# Patient Record
Sex: Female | Born: 1955
Health system: Southern US, Community
[De-identification: ages and names within clinical notes are randomized; demographics above are authoritative.]

## PROBLEM LIST (undated history)

## (undated) DIAGNOSIS — E079 Disorder of thyroid, unspecified: Secondary | ICD-10-CM

## (undated) DIAGNOSIS — G43909 Migraine, unspecified, not intractable, without status migrainosus: Secondary | ICD-10-CM

## (undated) HISTORY — PX: BREAST REDUCTION SURGERY: SHX8

## (undated) HISTORY — PX: ABDOMINAL HYSTERECTOMY: SHX81

## (undated) HISTORY — PX: CHOLECYSTECTOMY: SHX55

## (undated) HISTORY — DX: Migraine, unspecified, not intractable, without status migrainosus: G43.909

## (undated) HISTORY — PX: THYROIDECTOMY: SHX17

---

## 1998-04-18 ENCOUNTER — Ambulatory Visit (HOSPITAL_COMMUNITY): Admission: RE | Admit: 1998-04-18 | Discharge: 1998-04-18 | Payer: Self-pay | Admitting: Obstetrics & Gynecology

## 1998-11-21 ENCOUNTER — Ambulatory Visit (HOSPITAL_COMMUNITY): Admission: RE | Admit: 1998-11-21 | Discharge: 1998-11-21 | Payer: Self-pay | Admitting: Gastroenterology

## 1998-11-21 ENCOUNTER — Encounter: Payer: Self-pay | Admitting: Gastroenterology

## 1999-04-30 ENCOUNTER — Encounter: Payer: Self-pay | Admitting: Surgery

## 1999-04-30 ENCOUNTER — Ambulatory Visit (HOSPITAL_COMMUNITY): Admission: RE | Admit: 1999-04-30 | Discharge: 1999-04-30 | Payer: Self-pay | Admitting: Surgery

## 1999-04-30 ENCOUNTER — Other Ambulatory Visit: Admission: RE | Admit: 1999-04-30 | Discharge: 1999-04-30 | Payer: Self-pay | Admitting: Surgery

## 1999-06-08 ENCOUNTER — Ambulatory Visit (HOSPITAL_COMMUNITY): Admission: RE | Admit: 1999-06-08 | Discharge: 1999-06-09 | Payer: Self-pay | Admitting: Surgery

## 1999-07-29 ENCOUNTER — Ambulatory Visit (HOSPITAL_COMMUNITY): Admission: RE | Admit: 1999-07-29 | Discharge: 1999-07-29 | Payer: Self-pay | Admitting: Obstetrics & Gynecology

## 1999-07-29 ENCOUNTER — Encounter: Payer: Self-pay | Admitting: Obstetrics & Gynecology

## 1999-12-21 ENCOUNTER — Other Ambulatory Visit: Admission: RE | Admit: 1999-12-21 | Discharge: 1999-12-21 | Payer: Self-pay | Admitting: Obstetrics & Gynecology

## 2001-01-23 ENCOUNTER — Other Ambulatory Visit: Admission: RE | Admit: 2001-01-23 | Discharge: 2001-01-23 | Payer: Self-pay | Admitting: Family Medicine

## 2002-02-13 ENCOUNTER — Other Ambulatory Visit: Admission: RE | Admit: 2002-02-13 | Discharge: 2002-02-13 | Payer: Self-pay | Admitting: Family Medicine

## 2002-10-04 ENCOUNTER — Ambulatory Visit (HOSPITAL_COMMUNITY): Admission: RE | Admit: 2002-10-04 | Discharge: 2002-10-04 | Payer: Self-pay | Admitting: *Deleted

## 2002-10-04 ENCOUNTER — Encounter: Payer: Self-pay | Admitting: *Deleted

## 2003-05-01 ENCOUNTER — Other Ambulatory Visit: Admission: RE | Admit: 2003-05-01 | Discharge: 2003-05-01 | Payer: Self-pay | Admitting: Obstetrics & Gynecology

## 2003-10-23 ENCOUNTER — Ambulatory Visit: Admission: RE | Admit: 2003-10-23 | Discharge: 2003-10-23 | Payer: Self-pay | Admitting: Family Medicine

## 2004-05-08 ENCOUNTER — Other Ambulatory Visit: Admission: RE | Admit: 2004-05-08 | Discharge: 2004-05-08 | Payer: Self-pay | Admitting: Obstetrics & Gynecology

## 2004-05-18 ENCOUNTER — Encounter: Admission: RE | Admit: 2004-05-18 | Discharge: 2004-05-18 | Payer: Self-pay | Admitting: Obstetrics & Gynecology

## 2005-07-06 ENCOUNTER — Other Ambulatory Visit: Admission: RE | Admit: 2005-07-06 | Discharge: 2005-07-06 | Payer: Self-pay | Admitting: Obstetrics & Gynecology

## 2005-07-14 ENCOUNTER — Encounter: Admission: RE | Admit: 2005-07-14 | Discharge: 2005-07-14 | Payer: Self-pay | Admitting: Obstetrics & Gynecology

## 2005-10-20 ENCOUNTER — Emergency Department (HOSPITAL_COMMUNITY): Admission: EM | Admit: 2005-10-20 | Discharge: 2005-10-20 | Payer: Self-pay | Admitting: Emergency Medicine

## 2006-11-15 ENCOUNTER — Encounter: Admission: RE | Admit: 2006-11-15 | Discharge: 2006-11-28 | Payer: Self-pay | Admitting: *Deleted

## 2006-11-17 ENCOUNTER — Ambulatory Visit (HOSPITAL_COMMUNITY): Admission: RE | Admit: 2006-11-17 | Discharge: 2006-11-17 | Payer: Self-pay | Admitting: *Deleted

## 2007-08-23 ENCOUNTER — Emergency Department (HOSPITAL_COMMUNITY): Admission: EM | Admit: 2007-08-23 | Discharge: 2007-08-23 | Payer: Self-pay | Admitting: Emergency Medicine

## 2007-08-29 ENCOUNTER — Encounter: Admission: RE | Admit: 2007-08-29 | Discharge: 2007-08-29 | Payer: Self-pay | Admitting: Gastroenterology

## 2008-07-17 ENCOUNTER — Other Ambulatory Visit: Admission: RE | Admit: 2008-07-17 | Discharge: 2008-07-17 | Payer: Self-pay | Admitting: Obstetrics & Gynecology

## 2009-07-30 ENCOUNTER — Ambulatory Visit: Admission: RE | Admit: 2009-07-30 | Discharge: 2009-07-30 | Payer: Self-pay | Admitting: Family Medicine

## 2011-01-03 ENCOUNTER — Encounter: Payer: Self-pay | Admitting: Obstetrics & Gynecology

## 2011-09-24 LAB — COMPREHENSIVE METABOLIC PANEL
ALT: 40 — ABNORMAL HIGH
Albumin: 4.2
Alkaline Phosphatase: 63
BUN: 14
CO2: 26
Creatinine, Ser: 0.69
Total Bilirubin: 0.9
Total Protein: 6.8

## 2011-09-24 LAB — CBC: Hemoglobin: 13.9

## 2011-09-24 LAB — DIFFERENTIAL
Basophils Relative: 0
Eosinophils Relative: 2
Lymphocytes Relative: 28
Monocytes Relative: 6
Neutro Abs: 5.1
Neutrophils Relative %: 64

## 2011-09-24 LAB — URINALYSIS, ROUTINE W REFLEX MICROSCOPIC
Bilirubin Urine: NEGATIVE
Ketones, ur: NEGATIVE
Nitrite: NEGATIVE
Protein, ur: NEGATIVE
Urobilinogen, UA: 0.2

## 2012-04-25 ENCOUNTER — Other Ambulatory Visit: Payer: Self-pay | Admitting: Obstetrics & Gynecology

## 2012-04-25 DIAGNOSIS — R928 Other abnormal and inconclusive findings on diagnostic imaging of breast: Secondary | ICD-10-CM

## 2012-05-02 ENCOUNTER — Ambulatory Visit
Admission: RE | Admit: 2012-05-02 | Discharge: 2012-05-02 | Disposition: A | Discharge status: Home / Self Care | Payer: 59 | Source: Ambulatory Visit | Attending: Obstetrics & Gynecology | Admitting: Obstetrics & Gynecology

## 2012-05-02 DIAGNOSIS — R928 Other abnormal and inconclusive findings on diagnostic imaging of breast: Secondary | ICD-10-CM

## 2012-06-05 ENCOUNTER — Other Ambulatory Visit: Payer: Self-pay | Admitting: Family Medicine

## 2012-06-05 DIAGNOSIS — R202 Paresthesia of skin: Secondary | ICD-10-CM

## 2012-06-09 ENCOUNTER — Ambulatory Visit
Admission: RE | Admit: 2012-06-09 | Discharge: 2012-06-09 | Disposition: A | Discharge status: Home / Self Care | Payer: 59 | Source: Ambulatory Visit | Attending: Family Medicine | Admitting: Family Medicine

## 2012-06-09 DIAGNOSIS — R202 Paresthesia of skin: Secondary | ICD-10-CM

## 2012-12-01 ENCOUNTER — Other Ambulatory Visit: Payer: Self-pay | Admitting: Neurology

## 2012-12-01 DIAGNOSIS — R209 Unspecified disturbances of skin sensation: Secondary | ICD-10-CM

## 2012-12-01 DIAGNOSIS — G43009 Migraine without aura, not intractable, without status migrainosus: Secondary | ICD-10-CM

## 2012-12-01 DIAGNOSIS — R9409 Abnormal results of other function studies of central nervous system: Secondary | ICD-10-CM

## 2012-12-07 ENCOUNTER — Ambulatory Visit
Admission: RE | Admit: 2012-12-07 | Discharge: 2012-12-07 | Disposition: A | Discharge status: Home / Self Care | Payer: 59 | Source: Ambulatory Visit | Attending: Neurology | Admitting: Neurology

## 2012-12-07 DIAGNOSIS — G43009 Migraine without aura, not intractable, without status migrainosus: Secondary | ICD-10-CM

## 2012-12-07 DIAGNOSIS — R209 Unspecified disturbances of skin sensation: Secondary | ICD-10-CM

## 2012-12-07 DIAGNOSIS — R9409 Abnormal results of other function studies of central nervous system: Secondary | ICD-10-CM

## 2013-04-25 ENCOUNTER — Other Ambulatory Visit: Payer: Self-pay | Admitting: Family Medicine

## 2013-04-25 DIAGNOSIS — R109 Unspecified abdominal pain: Secondary | ICD-10-CM

## 2013-04-26 ENCOUNTER — Ambulatory Visit
Admission: RE | Admit: 2013-04-26 | Discharge: 2013-04-26 | Disposition: A | Discharge status: Home / Self Care | Payer: 59 | Source: Ambulatory Visit | Attending: Family Medicine | Admitting: Family Medicine

## 2013-04-26 DIAGNOSIS — R109 Unspecified abdominal pain: Secondary | ICD-10-CM

## 2014-01-23 ENCOUNTER — Other Ambulatory Visit: Payer: Self-pay | Admitting: Neurology

## 2014-05-17 ENCOUNTER — Other Ambulatory Visit: Payer: Self-pay | Admitting: Obstetrics & Gynecology

## 2014-05-17 DIAGNOSIS — N6489 Other specified disorders of breast: Secondary | ICD-10-CM

## 2014-05-17 DIAGNOSIS — R922 Inconclusive mammogram: Secondary | ICD-10-CM

## 2014-05-28 ENCOUNTER — Other Ambulatory Visit: Payer: 59

## 2014-05-28 ENCOUNTER — Ambulatory Visit
Admission: RE | Admit: 2014-05-28 | Discharge: 2014-05-28 | Disposition: A | Discharge status: Home / Self Care | Payer: 59 | Source: Ambulatory Visit | Attending: Obstetrics & Gynecology | Admitting: Obstetrics & Gynecology

## 2014-05-28 DIAGNOSIS — N6489 Other specified disorders of breast: Secondary | ICD-10-CM

## 2014-05-28 DIAGNOSIS — R922 Inconclusive mammogram: Secondary | ICD-10-CM

## 2014-07-02 ENCOUNTER — Other Ambulatory Visit: Payer: Self-pay | Admitting: Gastroenterology

## 2014-07-02 DIAGNOSIS — R1013 Epigastric pain: Secondary | ICD-10-CM

## 2014-07-08 ENCOUNTER — Ambulatory Visit
Admission: RE | Admit: 2014-07-08 | Discharge: 2014-07-08 | Disposition: A | Discharge status: Home / Self Care | Payer: 59 | Source: Ambulatory Visit | Attending: Gastroenterology | Admitting: Gastroenterology

## 2014-07-08 DIAGNOSIS — R1013 Epigastric pain: Secondary | ICD-10-CM

## 2014-07-08 MED ORDER — GADOBENATE DIMEGLUMINE 529 MG/ML IV SOLN
18.0000 mL | Freq: Once | INTRAVENOUS | Status: AC | PRN
  Administered 2014-07-08: 18 mL via INTRAVENOUS

## 2015-05-19 ENCOUNTER — Other Ambulatory Visit: Payer: Self-pay | Admitting: Obstetrics & Gynecology

## 2015-05-20 LAB — CYTOLOGY - PAP

## 2016-12-16 ENCOUNTER — Other Ambulatory Visit: Payer: Self-pay

## 2016-12-16 DIAGNOSIS — M720 Palmar fascial fibromatosis [Dupuytren]: Secondary | ICD-10-CM | POA: Diagnosis not present

## 2016-12-16 DIAGNOSIS — D492 Neoplasm of unspecified behavior of bone, soft tissue, and skin: Secondary | ICD-10-CM | POA: Diagnosis not present

## 2016-12-16 DIAGNOSIS — R2232 Localized swelling, mass and lump, left upper limb: Secondary | ICD-10-CM | POA: Diagnosis not present

## 2017-01-17 DIAGNOSIS — R2232 Localized swelling, mass and lump, left upper limb: Secondary | ICD-10-CM | POA: Diagnosis not present

## 2017-05-23 DIAGNOSIS — R7303 Prediabetes: Secondary | ICD-10-CM | POA: Diagnosis not present

## 2017-05-23 DIAGNOSIS — E7800 Pure hypercholesterolemia, unspecified: Secondary | ICD-10-CM | POA: Diagnosis not present

## 2017-05-23 DIAGNOSIS — Z01419 Encounter for gynecological examination (general) (routine) without abnormal findings: Secondary | ICD-10-CM | POA: Diagnosis not present

## 2017-05-23 DIAGNOSIS — Z0000 Encounter for general adult medical examination without abnormal findings: Secondary | ICD-10-CM | POA: Diagnosis not present

## 2017-05-23 DIAGNOSIS — E039 Hypothyroidism, unspecified: Secondary | ICD-10-CM | POA: Diagnosis not present

## 2017-08-02 DIAGNOSIS — J0100 Acute maxillary sinusitis, unspecified: Secondary | ICD-10-CM | POA: Diagnosis not present

## 2017-08-13 DIAGNOSIS — T63461A Toxic effect of venom of wasps, accidental (unintentional), initial encounter: Secondary | ICD-10-CM | POA: Diagnosis not present

## 2017-11-15 DIAGNOSIS — M1811 Unilateral primary osteoarthritis of first carpometacarpal joint, right hand: Secondary | ICD-10-CM | POA: Diagnosis not present

## 2017-11-15 DIAGNOSIS — M720 Palmar fascial fibromatosis [Dupuytren]: Secondary | ICD-10-CM | POA: Diagnosis not present

## 2018-02-28 DIAGNOSIS — H6981 Other specified disorders of Eustachian tube, right ear: Secondary | ICD-10-CM | POA: Diagnosis not present

## 2018-02-28 DIAGNOSIS — R350 Frequency of micturition: Secondary | ICD-10-CM | POA: Diagnosis not present

## 2018-05-24 DIAGNOSIS — Z0000 Encounter for general adult medical examination without abnormal findings: Secondary | ICD-10-CM | POA: Diagnosis not present

## 2018-05-24 DIAGNOSIS — Z01419 Encounter for gynecological examination (general) (routine) without abnormal findings: Secondary | ICD-10-CM | POA: Diagnosis not present

## 2018-05-24 DIAGNOSIS — E039 Hypothyroidism, unspecified: Secondary | ICD-10-CM | POA: Diagnosis not present

## 2018-05-24 DIAGNOSIS — R7303 Prediabetes: Secondary | ICD-10-CM | POA: Diagnosis not present

## 2018-05-24 DIAGNOSIS — E7800 Pure hypercholesterolemia, unspecified: Secondary | ICD-10-CM | POA: Diagnosis not present

## 2018-05-24 DIAGNOSIS — Z6829 Body mass index (BMI) 29.0-29.9, adult: Secondary | ICD-10-CM | POA: Diagnosis not present

## 2018-05-26 ENCOUNTER — Other Ambulatory Visit: Payer: Self-pay | Admitting: Urology

## 2018-05-26 DIAGNOSIS — R300 Dysuria: Secondary | ICD-10-CM | POA: Diagnosis not present

## 2018-05-26 DIAGNOSIS — N361 Urethral diverticulum: Secondary | ICD-10-CM

## 2018-05-26 DIAGNOSIS — N393 Stress incontinence (female) (male): Secondary | ICD-10-CM | POA: Diagnosis not present

## 2018-05-26 DIAGNOSIS — R350 Frequency of micturition: Secondary | ICD-10-CM | POA: Diagnosis not present

## 2018-06-08 ENCOUNTER — Ambulatory Visit (HOSPITAL_COMMUNITY)
Admission: RE | Admit: 2018-06-08 | Discharge: 2018-06-08 | Disposition: A | Discharge status: Home / Self Care | Payer: 59 | Source: Ambulatory Visit | Attending: Urology | Admitting: Urology

## 2018-06-08 DIAGNOSIS — N361 Urethral diverticulum: Secondary | ICD-10-CM | POA: Insufficient documentation

## 2018-06-08 DIAGNOSIS — R102 Pelvic and perineal pain: Secondary | ICD-10-CM | POA: Diagnosis not present

## 2018-06-08 MED ORDER — GADOBENATE DIMEGLUMINE 529 MG/ML IV SOLN
20.0000 mL | Freq: Once | INTRAVENOUS | Status: AC | PRN
  Administered 2018-06-08: 16 mL via INTRAVENOUS

## 2018-06-09 DIAGNOSIS — R350 Frequency of micturition: Secondary | ICD-10-CM | POA: Diagnosis not present

## 2018-06-09 DIAGNOSIS — R300 Dysuria: Secondary | ICD-10-CM | POA: Diagnosis not present

## 2018-06-09 LAB — POCT I-STAT CREATININE: CREATININE: 0.7 mg/dL (ref 0.44–1.00)

## 2018-08-11 DIAGNOSIS — L821 Other seborrheic keratosis: Secondary | ICD-10-CM | POA: Diagnosis not present

## 2018-08-11 DIAGNOSIS — L814 Other melanin hyperpigmentation: Secondary | ICD-10-CM | POA: Diagnosis not present

## 2018-08-11 DIAGNOSIS — D225 Melanocytic nevi of trunk: Secondary | ICD-10-CM | POA: Diagnosis not present

## 2018-08-31 DIAGNOSIS — L578 Other skin changes due to chronic exposure to nonionizing radiation: Secondary | ICD-10-CM | POA: Diagnosis not present

## 2018-08-31 DIAGNOSIS — L570 Actinic keratosis: Secondary | ICD-10-CM | POA: Diagnosis not present

## 2019-03-17 IMAGING — MR MR PELVIS WO/W CM
4 of 7 series · 19 of 48 positions shown · IV contrast (multihance)
Comparison: None.

CLINICAL DATA: Pelvic/bladder pain x4 months, evaluate for urethral
diverticulum

EXAM:
MRI PELVIS WITHOUT AND WITH CONTRAST
TECHNIQUE: Multiplanar multisequence MR imaging of the pelvis was performed
both before and after administration of intravenous contrast.
CONTRAST:  16mL MULTIHANCE GADOBENATE DIMEGLUMINE 529 MG/ML IV SOLN

[Series 3: T2 · axial · 5.0mm · 0.47mm/px · z∈[-64,+152]mm · 7 of 37 slices shown (1 of 2)]
[im 1/37]
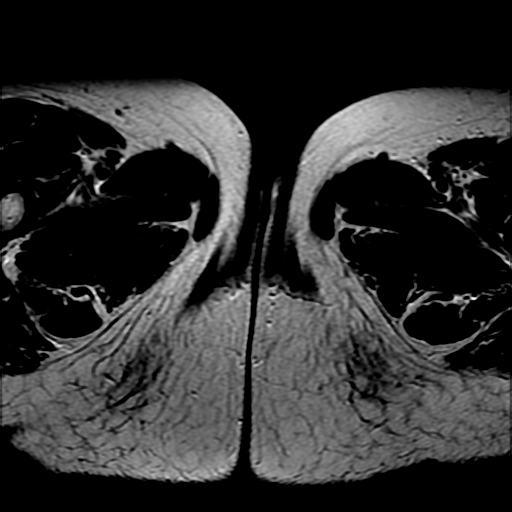
[im 7/37]
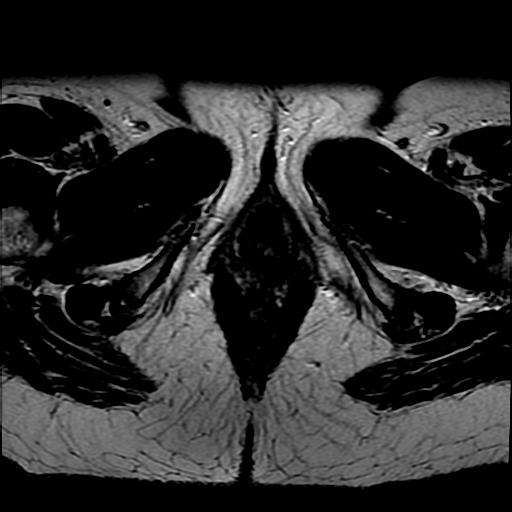
[im 13/37]
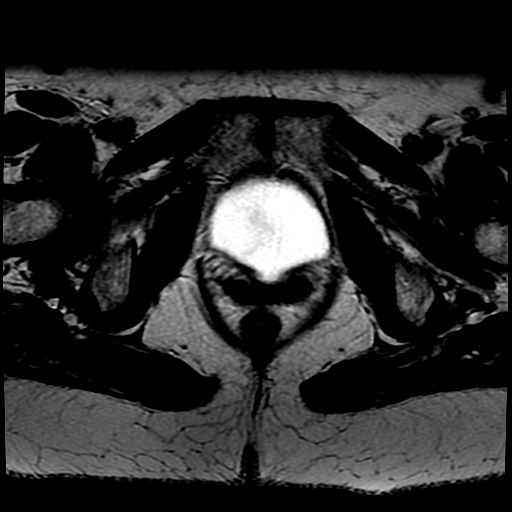
[im 19/37]
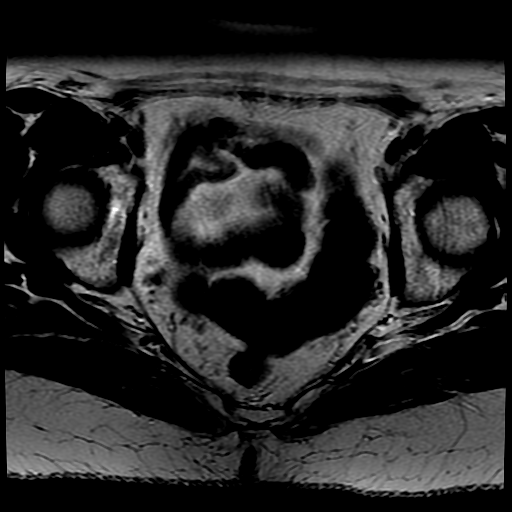
[im 25/37]
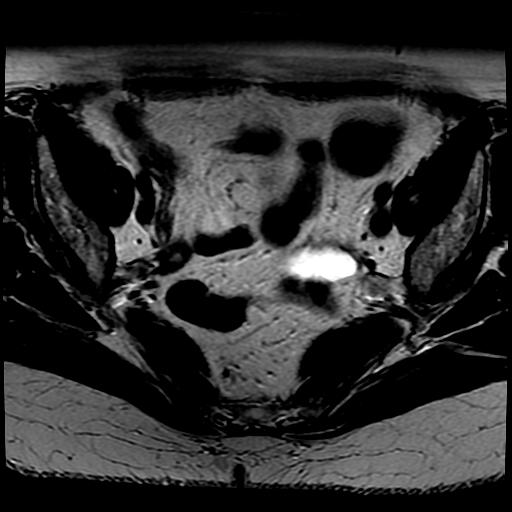
[im 31/37]
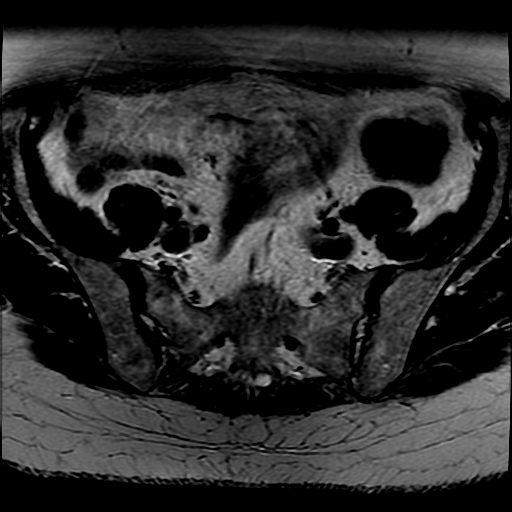
[im 37/37]
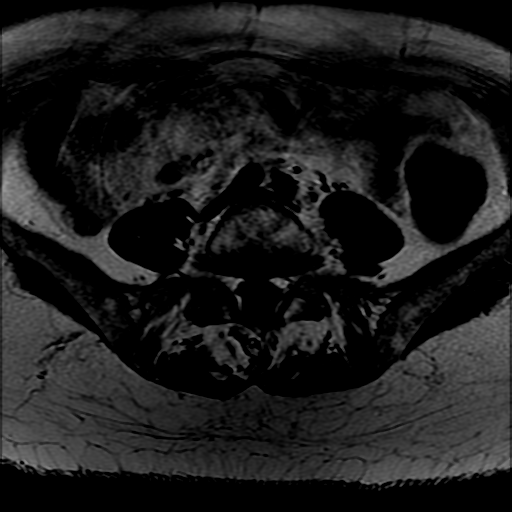

[Series 4: T2 · sagittal · 5.0mm · 0.47mm/px · 5 of 29 slices shown (2 of 2)]
[im 1/29]
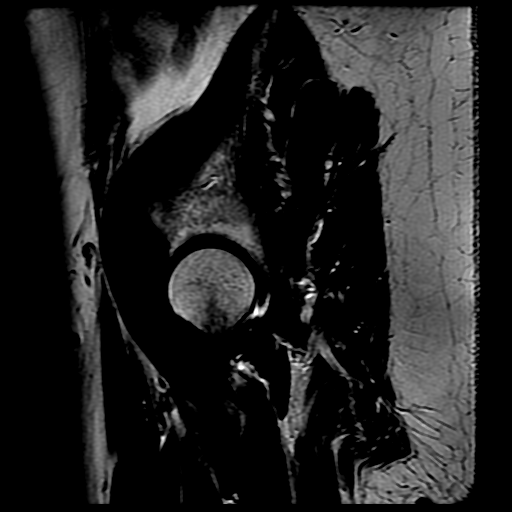
[im 8/29]
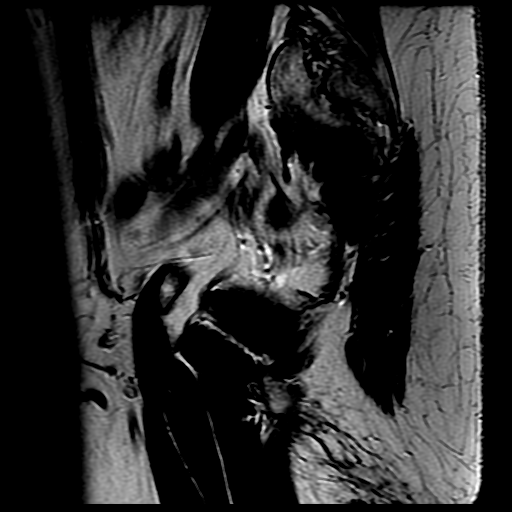
[im 15/29]
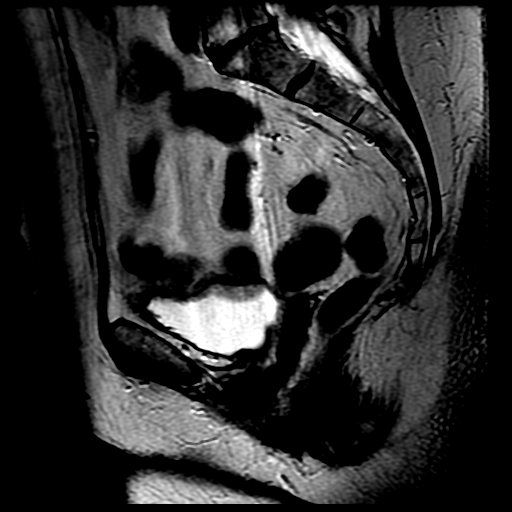
[im 22/29]
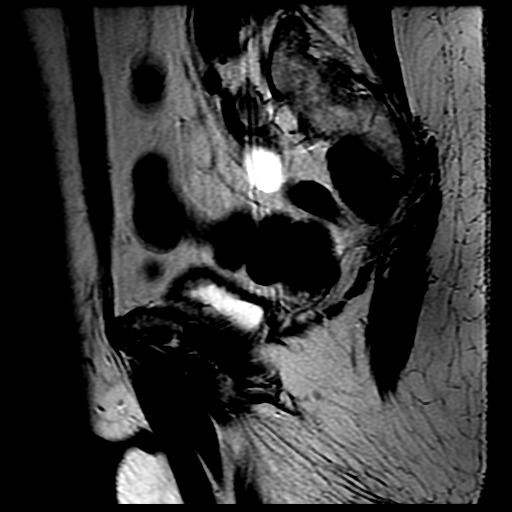
[im 29/29]
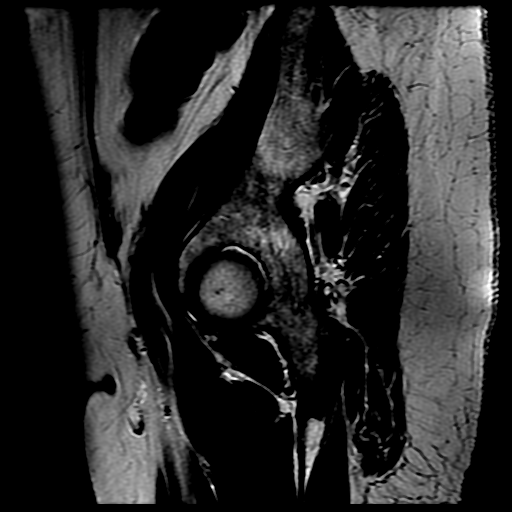

[Series 5: T2 fat-sat · axial · 5.0mm · 0.47mm/px · z∈[-64,+116]mm · 4 of 37 slices shown (1 of 2)]
[im 1/37]
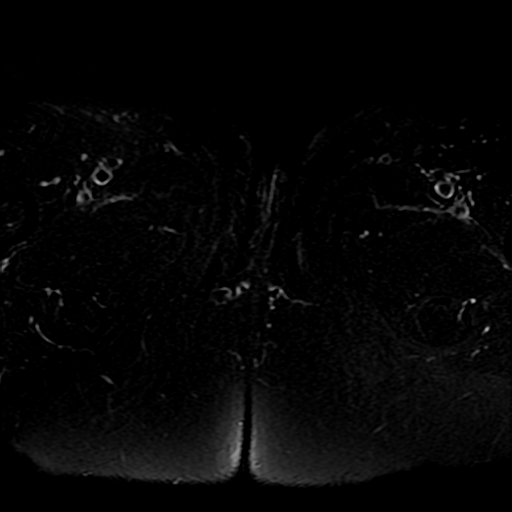
[im 7/37]
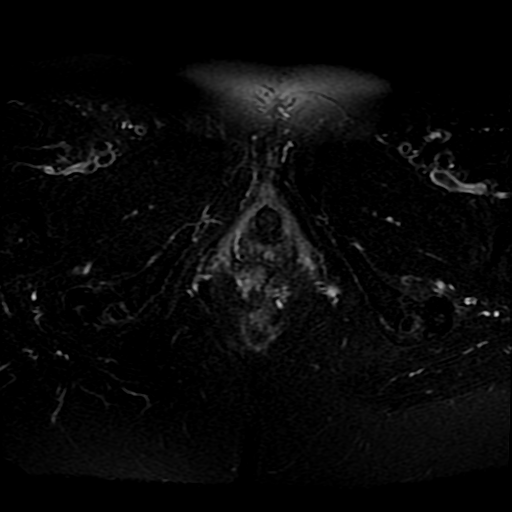
[im 19/37]
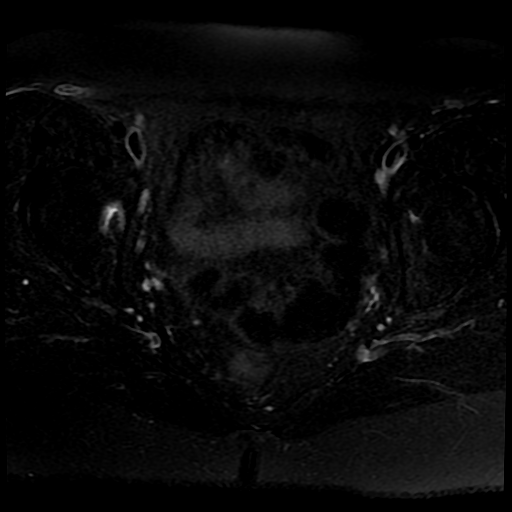
[im 31/37]
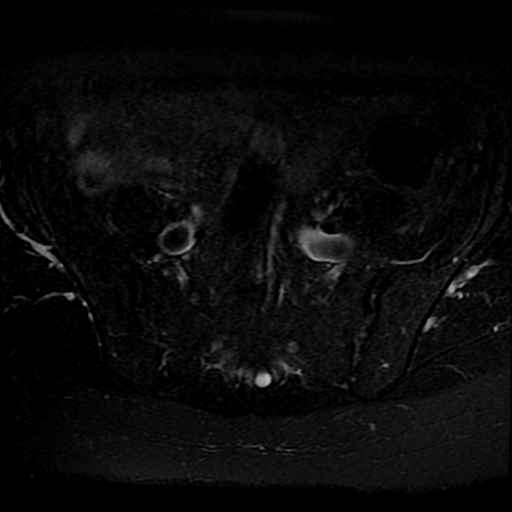

[Series 6: T2 fat-sat · coronal · 4.0mm · 0.39mm/px · 3 of 43 slices shown (2 of 2)]
[im 7/43]
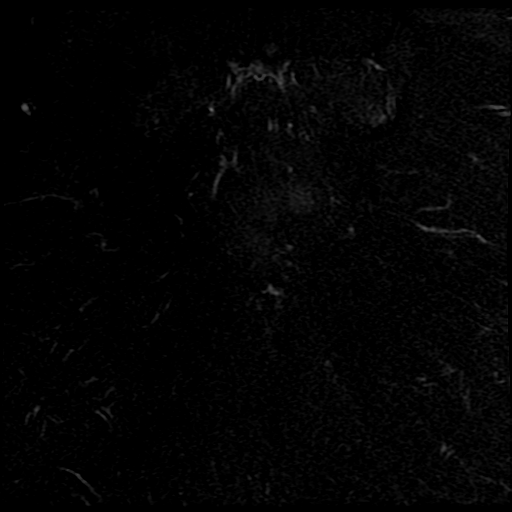
[im 25/43]
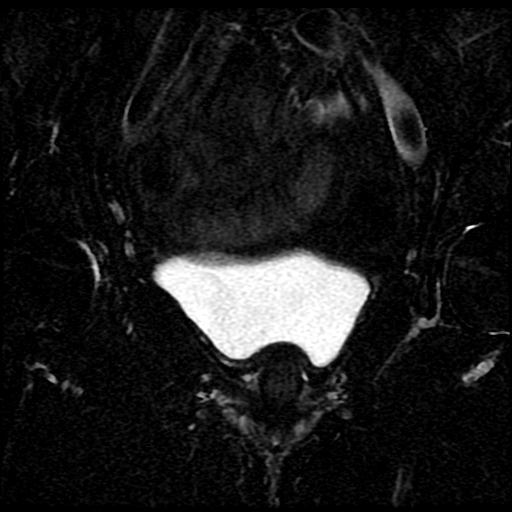
[im 37/43]
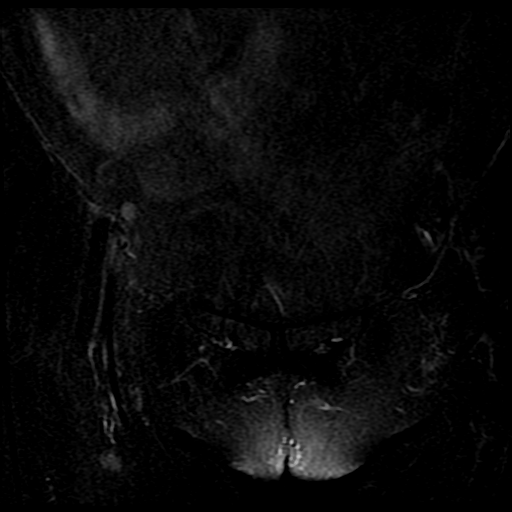

[19 of 48 positions shown; findings below may reference images not displayed]

FINDINGS: Urinary Tract:  Bladder is within normal limits.

No findings to suggest urethral diverticulum.

Bowel:  Visualized bowel is unremarkable.

Vascular/Lymphatic: No evidence of aneurysm.

No suspicious pelvic lymphadenopathy.

Reproductive:  Status post hysterectomy.

No findings to suggest vaginal wall or perineal cyst.

2.3 x 3.9 cm left adnexal cyst (series 3/image 12). The bilateral
ovaries are not discretely visualized.

Other:  No pelvic ascites.

Musculoskeletal: No focal osseous lesions.
IMPRESSION: No findings to suggest urethral diverticulum, vaginal wall cyst, or
perineal cyst.

## 2020-01-14 ENCOUNTER — Encounter: Payer: Self-pay | Admitting: Physician Assistant

## 2021-09-24 ENCOUNTER — Other Ambulatory Visit: Payer: Self-pay | Admitting: Urology

## 2021-10-20 NOTE — Patient Instructions (Addendum)
DUE TO COVID-19 ONLY ONE VISITOR IS ALLOWED TO COME WITH YOU AND STAY IN THE WAITING ROOM ONLY DURING PRE OP AND PROCEDURE.   **NO VISITORS ARE ALLOWED IN THE SHORT STAY AREA OR RECOVERY ROOM!!**  IF YOU WILL BE ADMITTED INTO THE HOSPITAL YOU ARE ALLOWED ONLY TWO SUPPORT PEOPLE DURING VISITATION HOURS ONLY (7AM -8PM)   The support person(s) may change daily. The support person(s) must pass our screening, gel in and out, and wear a mask at all times, including in the patient's room. Patients must also wear a mask when staff or their support person are in the room.  No visitors under the age of 39. Any visitor under the age of 70 must be accompanied by an adult.    COVID SWAB TESTING MUST BE COMPLETED ON:  10/23/21 **MUST PRESENT COMPLETED FORM AT TESTING SITE**    [place] (backside of the building) Open 8am-3pm. No appointment needed. You are not required to quarantine, however you are required to wear a well-fitted mask when you are out and around people not in your household.  Hand Hygiene often Do NOT share personal items Notify your provider if you are in close contact with someone who has COVID or you develop fever 100.4 or greater, new onset of sneezing, cough, sore throat, shortness of breath or body aches.       Your procedure is scheduled on: 10/27/21   Report to [facility] Main Entrance   Report to front desk at 5:15 AM     Call this number if you have problems the morning of surgery [phone]   Do not eat food :After Midnight.   May have liquids until 4:30 AM day of surgery  CLEAR LIQUID DIET  Foods Allowed                                                                     Foods Excluded  Water, Black Coffee and tea (no milk or creamer)           liquids that you cannot  Plain Jell-O in any flavor  (No red)                                    see through such as: Fruit ices (not with fruit pulp)                                             milk, soups, orange juice              Iced Popsicles (No red)                                                All solid food  Apple juices Sports drinks like Gatorade (No red) Lightly seasoned clear broth or consume(fat free) Sugar   Oral Hygiene is also important to reduce your risk of infection.                                    Remember - BRUSH YOUR TEETH THE MORNING OF SURGERY WITH YOUR REGULAR TOOTHPASTE   Take these medicines the morning of surgery with A SIP OF WATER: Synthroid                               You may not have any metal on your body including hair pins, jewelry, and body piercing             Do not wear make-up, lotions, powders, perfumes, or deodorant  Do not wear nail polish including gel and S&S, artificial/acrylic nails, or any other type of covering on natural nails including finger and toenails. If you have artificial nails, gel coating, etc. that needs to be removed by a nail salon please have this removed prior to surgery or surgery may need to be canceled/ delayed if the surgeon/ anesthesia feels like they are unable to be safely monitored.   Do not shave  48 hours prior to surgery.    Do not bring valuables to the hospital. [place].   Bring small overnight bag day of surgery.  Please read over the following fact sheets you were given: IF YOU HAVE QUESTIONS ABOUT YOUR PRE-OP INSTRUCTIONS PLEASE CALL [place] - Preparing for Surgery Before surgery, you can play an important role.  Because skin is not sterile, your skin needs to be as free of germs as possible.  You can reduce the number of germs on your skin by washing with CHG (chlorahexidine gluconate) soap before surgery.  CHG is an antiseptic cleaner which kills germs and bonds with the skin to continue killing germs even after washing. Please DO NOT use if you have an allergy to CHG or  antibacterial soaps.  If your skin becomes reddened/irritated stop using the CHG and inform your nurse when you arrive at Short Stay. Do not shave (including legs and underarms) for at least 48 hours prior to the first CHG shower.  You may shave your face/neck.  Please follow these instructions carefully:  1.  Shower with CHG Soap the night before surgery and the  morning of surgery.  2.  If you choose to wash your hair, wash your hair first as usual with your normal  shampoo.  3.  After you shampoo, rinse your hair and body thoroughly to remove the shampoo.                             4.  Use CHG as you would any other liquid soap.  You can apply chg directly to the skin and wash.  Gently with a scrungie or clean washcloth.  5.  Apply the CHG Soap to your body ONLY FROM THE NECK DOWN.   Do   not use on face/ open  Wound or open sores. Avoid contact with eyes, ears mouth and   genitals (private parts).                       Wash face,  Genitals (private parts) with your normal soap.             6.  Wash thoroughly, paying special attention to the area where your    surgery  will be performed.  7.  Thoroughly rinse your body with warm water from the neck down.  8.  DO NOT shower/wash with your normal soap after using and rinsing off the CHG Soap.                9.  Pat yourself dry with a clean towel.            10.  Wear clean pajamas.            11.  Place clean sheets on your bed the night of your first shower and do not  sleep with pets. Day of Surgery : Do not apply any lotions/deodorants the morning of surgery.  Please wear clean clothes to the hospital/surgery center.  FAILURE TO FOLLOW THESE INSTRUCTIONS MAY RESULT IN THE CANCELLATION OF YOUR SURGERY  PATIENT SIGNATURE_________________________________  NURSE SIGNATURE__________________________________  ________________________________________________________________________

## 2021-10-20 NOTE — Progress Notes (Addendum)
COVID swab appointment: 10/23/21  COVID Vaccine Completed: yes x2 Date COVID Vaccine completed: Has received booster: COVID vaccine manufacturer: [place]   Date of COVID positive in last 90 days: no  PCP - Shirline Frees, MD Cardiologist - n/a  Chest x-ray - n/a EKG - 10/21/21 Epic/chart Stress Test - yes long time ago per pt ECHO - n/a Cardiac Cath - n/a Pacemaker/ICD device last checked: n/a Spinal Cord Stimulator: n/a  Sleep Study - n/a CPAP -   Fasting Blood Sugar - DM does not check at home, pt denies history Checks Blood Sugar _____ times a day  Blood Thinner Instructions: n/a Aspirin Instructions: Last Dose:  Activity level: Can go up a flight of stairs and perform activities of daily living without stopping and without symptoms of chest pain or shortness of breath.    Anesthesia review:   Patient denies shortness of breath, fever, cough and chest pain at PAT appointment   Patient verbalized understanding of instructions that were given to them at the PAT appointment. Patient was also instructed that they will need to review over the PAT instructions again at home before surgery.

## 2021-10-20 NOTE — H&P (Signed)
2019  I saw patient 2019 for mild urethritis and discomfort in the urethra. I got an MRI and she did not have a Skene gland cyst. She may have had a little bit of urethral mucosal prolapse. She had a small grade 1 cystocele   Today  I was consulted to assess the patient's prolapse. She feels vaginal bulging. She reduces it. No splinting maneuvers. She has had a hysterectomy.   At baseline she leaks rarely with coughing sneezing and urgency. She wears 2 liners a day that can be damp or moderately wet. No bedwetting   She voids every 1-2 hours. If she holds longer she can leak. She gets up 0-1 time at night. She reports a good flow   no further urethral discomfort issues   On pelvic examination patient had grade 2 cystocele with central defect. When she would cough or bear down strongly she had rotational descent but still had good vaginal length. I think the vaginal cuff to sent from 8 or 9 cm to approximately 7 cm but I could not see the dimples at the apex. She had a grade 1 diffuse rectocele. She a grade 2 hypermobility the bladder neck and negative cough test. She had a lot of spring back and at rest had minimal defect.   urine reviewed. Urine sent for culture. Chart reviewed. Bladder scan 95 mL but patient had not voided   Patient has mild mixed incontinence. She has moderate frequency and prolapse symptoms. Picture was drawn. Patient had surgery she would likely best benefit from a anterior repair. She would be consented for a vault suspension rectocele repair but probably does not need one. Role of urodynamics discussed.With contrast also reassess the size of her cystocele   Today  Frequency stable. Incontinence stable.  On urodynamics the patient did not void and was catheterized for a few mL. Maximum bladder capacity was 550 mL. Bladder was stable. No stress incontinence but could not generate a Valsalva pressure greater than 54 cm water. During voluntary voiding she voided 51 mL with a  maximum flow of 8 mL/second. Maximum voiding pressure was 38 cm water. Residual was approximately 680 mL. EMG activities was within normal limits. Cystocele did not greatly increase with bladder neck descended 1 cm. Mild bladder trabeculation. No stress incontinence with without prolapse reduced. The details of the urodynamics or sign dictated   Picture drawn. Watchful waiting versus pessary versus transvaginal cystocele repair versus transvaginal vault suspension cystocele repair and graft and rectocele repaired described. Persistent incontinence or worsening incontinence with sequelae discussed. Need for future procedures discussed. A initial postvoid residual was 94 mL. She does have a large capacity bladder. She might be at higher risk of retention with a sling. She understands her incontinence would be treated nonsurgically this point   patient would like to proceed with surgery     ALLERGIES: None   MEDICATIONS: Advil  Aleve  Allegra Allergy  Cyclobenzaprine Hcl 10 mg tablet  Krill Oil  Magnesium  [place]  Multivitamin  Synthroid 88 mcg tablet tablet     GU PSH: Complex cystometrogram, w/ void pressure and urethral pressure profile studies, any technique - 09/09/2021 Complex Uroflow - 09/09/2021 Cystoscopy - 2019 Emg surf Electrd - 09/09/2021 Inject For cystogram - 09/09/2021 Intrabd voidng Press - 09/09/2021     NON-GU PSH: Breast Reduction - 2004 Partial Hysterectomy - 1992 Remove Gallbladder - 1991 Thyroidectomy - 1999 Total Hysterectomy - 1993     GU PMH: Mixed incontinence - 09/09/2021, -  08/28/2021 Cystocele, midline - 08/28/2021 Nocturia - 08/28/2021 Urinary Frequency (Stable) - 08/28/2021, - 2019 Dysuria - 2019 Stress Incontinence - 2019    NON-GU PMH: Arthritis    FAMILY HISTORY: 2 sons - Son father deceased - Father Kidney Failure - Father Kidney Transplant - Father mother living - Mother   SOCIAL HISTORY: Marital Status: Married Ethnicity: Not Hispanic  Or Latino; Race: White Current Smoking Status: Patient has never smoked.   Tobacco Use Assessment Completed: Used Tobacco in last 30 days? Drinks 4 drinks per week. Types of alcohol consumed: Wine.  Does not use drugs. Drinks 2 caffeinated drinks per day. 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    REVIEW OF SYSTEMS:    GU Review Female:   Patient reports frequent urination, hard to postpone urination, leakage of urine, and have to strain to urinate. Patient denies burning /pain with urination, get up at night to urinate, stream starts and stops, trouble starting your stream, and being pregnant.  Gastrointestinal (Upper):   Patient denies nausea, vomiting, and indigestion/ heartburn.  Gastrointestinal (Lower):   Patient denies diarrhea and constipation.  Constitutional:   Patient denies fever, night sweats, weight loss, and fatigue.  Skin:   Patient denies skin rash/ lesion and itching.  Eyes:   Patient denies blurred vision and double vision.  Ears/ Nose/ Throat:   Patient denies sore throat and sinus problems.  Hematologic/Lymphatic:   Patient denies swollen glands and easy bruising.  Cardiovascular:   Patient denies leg swelling and chest pains.  Respiratory:   Patient denies cough and shortness of breath.  Endocrine:   Patient denies excessive thirst.  Musculoskeletal:   Patient reports back pain. Patient denies joint pain.  Neurological:   Patient denies headaches and dizziness.  Psychologic:   Patient denies anxiety and depression.   VITAL SIGNS: None   PAST DATA REVIEW: None   PROCEDURES:          Urinalysis w/Scope Dipstick Dipstick Cont'd Micro  Color: Yellow Bilirubin: Neg mg/dL WBC/hpf: 0 - 5/hpf  Appearance: Clear Ketones: Neg mg/dL RBC/hpf: NS (Not Seen)  Specific Gravity: 1.015 Blood: Neg ery/uL Bacteria: NS (Not Seen)  pH: 5.5 Protein: Neg mg/dL Cystals: NS (Not Seen)  Glucose: Neg mg/dL Urobilinogen: 0.2 mg/dL Casts: NS (Not Seen)     Nitrites: Neg Trichomonas: Not Present    Leukocyte Esterase: Trace leu/uL Mucous: Not Present      Epithelial Cells: 0 - 5/hpf      Yeast: NS (Not Seen)      Sperm: Not Present    ASSESSMENT:      ICD-10 Details  1 GU:   Mixed incontinence - N39.46   2   Cystocele, midline - N81.11               Notes:   I drew her a picture and we talked about prolapse surgery in detail. Pros, cons, general surgical and anesthetic risks, and other options including behavioral therapy, pessaries, and watchful waiting were discussed. She understands that prolapse repairs are successful in 80-85% of cases for prolapse symptoms and can recur anteriorly, posteriorly, and/or apically. She understands that in most cases I use a graft and general risks were discussed. Surgical risks were described but not limited to the discussion of injury to neighboring structures including the bowel (with possible life-threatening sepsis and colostomy), bladder, urethra, vagina (all resulting in further surgery), and ureter (resulting in re-implantation). We talked about injury to nerves/soft tissue leading to debilitating  and intractable pelvic, abdominal, and lower extremity pain syndromes and neuropathies. The risks of buttock pain, intractable dyspareunia, and vaginal narrowing and shortening with sequelae were discussed. Bleeding risks, transfusion rates, and infection were discussed. The risk of persistent, de novo, or worsening bladder and/or bowel incontinence/dysfunction was discussed. The need for CIC was described as well the usual post-operative course. The patient understands that she might not reach her treatment goal and that she might be worse following surgery.

## 2021-10-21 ENCOUNTER — Encounter (HOSPITAL_COMMUNITY): Payer: Self-pay

## 2021-10-21 ENCOUNTER — Encounter (HOSPITAL_COMMUNITY)
Admission: RE | Admit: 2021-10-21 | Discharge: 2021-10-21 | Disposition: A | Discharge status: Home / Self Care | Payer: 59 | Source: Ambulatory Visit | Attending: Urology | Admitting: Urology

## 2021-10-21 VITALS — BP 143/85 | HR 91 | Temp 97.9°F | Resp 14 | Ht 64.5 in | Wt 160.2 lb

## 2021-10-21 DIAGNOSIS — E119 Type 2 diabetes mellitus without complications: Secondary | ICD-10-CM | POA: Insufficient documentation

## 2021-10-21 DIAGNOSIS — Z01818 Encounter for other preprocedural examination: Secondary | ICD-10-CM | POA: Diagnosis not present

## 2021-10-21 HISTORY — DX: Disorder of thyroid, unspecified: E07.9

## 2021-10-21 LAB — CBC
HCT: 44.9 % (ref 36.0–46.0)
Hemoglobin: 14.5 g/dL (ref 12.0–15.0)
MCH: 30.3 pg (ref 26.0–34.0)
MCHC: 32.3 g/dL (ref 30.0–36.0)
MCV: 93.7 fL (ref 80.0–100.0)
Platelets: 225 10*3/uL (ref 150–400)
RBC: 4.79 MIL/uL (ref 3.87–5.11)
RDW: 11.9 % (ref 11.5–15.5)
WBC: 4.8 10*3/uL (ref 4.0–10.5)
nRBC: 0 % (ref 0.0–0.2)

## 2021-10-21 LAB — BASIC METABOLIC PANEL
Anion gap: 7 (ref 5–15)
BUN: 17 mg/dL (ref 8–23)
CO2: 30 mmol/L (ref 22–32)
Calcium: 10.2 mg/dL (ref 8.9–10.3)
Chloride: 103 mmol/L (ref 98–111)
Creatinine, Ser: 0.65 mg/dL (ref 0.44–1.00)
GFR, Estimated: >60 mL/min (ref >60)
Glucose, Bld: 114 mg/dL — ABNORMAL HIGH (ref 70–99)
Potassium: 4.2 mmol/L (ref 3.5–5.1)
Sodium: 140 mmol/L (ref 135–145)

## 2021-10-21 LAB — PROTIME-INR
INR: 0.9 (ref 0.8–1.2)
Prothrombin Time: 11.9 seconds (ref 11.4–15.2)

## 2021-10-21 LAB — GLUCOSE, CAPILLARY: Glucose-Capillary: 123 mg/dL — ABNORMAL HIGH (ref 70–99)

## 2021-10-21 LAB — HEMOGLOBIN A1C
Hgb A1c MFr Bld: 6.3 % — ABNORMAL HIGH (ref 4.8–5.6)
Mean Plasma Glucose: 134.11 mg/dL

## 2021-10-23 ENCOUNTER — Other Ambulatory Visit: Payer: Self-pay | Admitting: Urology

## 2021-10-23 LAB — SARS CORONAVIRUS 2 (TAT 6-24 HRS): SARS Coronavirus 2: NEGATIVE

## 2021-10-26 NOTE — Anesthesia Preprocedure Evaluation (Addendum)
Anesthesia Evaluation  Patient identified by MRN, date of birth, ID band Patient awake    Reviewed: Allergy & Precautions, NPO status , Patient's Chart, lab work & pertinent test results  Airway Mallampati: II  TM Distance: >3 FB Neck ROM: Full    Dental no notable dental hx. (+) Teeth Intact, Dental Advisory Given   Pulmonary neg pulmonary ROS,    Pulmonary exam normal breath sounds clear to auscultation       Cardiovascular negative cardio ROS Normal cardiovascular exam Rhythm:Regular Rate:Normal     Neuro/Psych  Headaches, negative psych ROS   GI/Hepatic negative GI ROS, Neg liver ROS,   Endo/Other  Hypothyroidism   Renal/GU negative Renal ROS  negative genitourinary   Musculoskeletal negative musculoskeletal ROS (+)   Abdominal   Peds  Hematology negative hematology ROS (+)   Anesthesia Other Findings   Reproductive/Obstetrics                            Anesthesia Physical Anesthesia Plan  ASA: 2  Anesthesia Plan: General   Post-op Pain Management:    Induction: Intravenous  PONV Risk Score and Plan: 3 and Midazolam, Dexamethasone and Ondansetron  Airway Management Planned: Oral ETT  Additional Equipment:   Intra-op Plan:   Post-operative Plan: Extubation in OR  Informed Consent: I have reviewed the patients History and Physical, chart, labs and discussed the procedure including the risks, benefits and alternatives for the proposed anesthesia with the patient or authorized representative who has indicated his/her understanding and acceptance.     Dental advisory given  Plan Discussed with: CRNA  Anesthesia Plan Comments:        Anesthesia Quick Evaluation

## 2021-10-27 ENCOUNTER — Ambulatory Visit (HOSPITAL_COMMUNITY): Payer: 59 | Admitting: Certified Registered"

## 2021-10-27 ENCOUNTER — Encounter (HOSPITAL_COMMUNITY)
Admission: RE | Disposition: A | Discharge status: Home / Self Care | Payer: Self-pay | Source: Home / Self Care | Attending: Urology

## 2021-10-27 ENCOUNTER — Encounter (HOSPITAL_COMMUNITY): Payer: Self-pay | Admitting: Urology

## 2021-10-27 ENCOUNTER — Other Ambulatory Visit: Payer: Self-pay

## 2021-10-27 ENCOUNTER — Observation Stay (HOSPITAL_COMMUNITY)
Admission: RE | Admit: 2021-10-27 | Discharge: 2021-10-28 | Disposition: A | Discharge status: Home / Self Care | Payer: 59 | Attending: Urology | Admitting: Urology

## 2021-10-27 DIAGNOSIS — Z9089 Acquired absence of other organs: Secondary | ICD-10-CM | POA: Diagnosis not present

## 2021-10-27 DIAGNOSIS — N3946 Mixed incontinence: Secondary | ICD-10-CM | POA: Diagnosis not present

## 2021-10-27 DIAGNOSIS — E1165 Type 2 diabetes mellitus with hyperglycemia: Secondary | ICD-10-CM

## 2021-10-27 DIAGNOSIS — N814 Uterovaginal prolapse, unspecified: Secondary | ICD-10-CM | POA: Diagnosis present

## 2021-10-27 DIAGNOSIS — Z90710 Acquired absence of both cervix and uterus: Secondary | ICD-10-CM | POA: Diagnosis not present

## 2021-10-27 DIAGNOSIS — Z9049 Acquired absence of other specified parts of digestive tract: Secondary | ICD-10-CM | POA: Insufficient documentation

## 2021-10-27 DIAGNOSIS — N8111 Cystocele, midline: Principal | ICD-10-CM | POA: Insufficient documentation

## 2021-10-27 HISTORY — PX: VAGINAL PROLAPSE REPAIR: SHX830

## 2021-10-27 HISTORY — PX: ANTERIOR AND POSTERIOR REPAIR: SHX5121

## 2021-10-27 LAB — TYPE AND SCREEN
ABO/RH(D): O POS
Antibody Screen: NEGATIVE

## 2021-10-27 LAB — ABO/RH: ABO/RH(D): O POS

## 2021-10-27 LAB — HEMOGLOBIN AND HEMATOCRIT, BLOOD
HCT: 37.3 % (ref 36.0–46.0)
Hemoglobin: 12.3 g/dL (ref 12.0–15.0)

## 2021-10-27 LAB — GLUCOSE, CAPILLARY: Glucose-Capillary: 109 mg/dL — ABNORMAL HIGH (ref 70–99)

## 2021-10-27 SURGERY — ANTERIOR (CYSTOCELE) AND POSTERIOR REPAIR (RECTOCELE)
Anesthesia: General

## 2021-10-27 MED ORDER — PHENYLEPHRINE 40 MCG/ML (10ML) SYRINGE FOR IV PUSH (FOR BLOOD PRESSURE SUPPORT)
PREFILLED_SYRINGE | INTRAVENOUS | Status: DC | PRN
  Administered 2021-10-27 (×4): 40 ug via INTRAVENOUS

## 2021-10-27 MED ORDER — DIPHENHYDRAMINE HCL 50 MG/ML IJ SOLN: 12.5000 mg | Freq: Four times a day (QID) | INTRAMUSCULAR | Status: DC | PRN

## 2021-10-27 MED ORDER — FENTANYL CITRATE (PF) 250 MCG/5ML IJ SOLN
INTRAMUSCULAR | Status: DC | PRN
  Administered 2021-10-27 (×6): 50 ug via INTRAVENOUS

## 2021-10-27 MED ORDER — ORAL CARE MOUTH RINSE: 15.0000 mL | Freq: Once | OROMUCOSAL | Status: AC

## 2021-10-27 MED ORDER — CLINDAMYCIN PHOSPHATE 900 MG/50ML IV SOLN
900.0000 mg | INTRAVENOUS | Status: AC
  Administered 2021-10-27: 900 mg via INTRAVENOUS
  Filled 2021-10-27: qty 50

## 2021-10-27 MED ORDER — PROPOFOL 10 MG/ML IV BOLUS
INTRAVENOUS | Status: AC
  Filled 2021-10-27: qty 40

## 2021-10-27 MED ORDER — FENTANYL CITRATE (PF) 100 MCG/2ML IJ SOLN
INTRAMUSCULAR | Status: AC
  Filled 2021-10-27: qty 2

## 2021-10-27 MED ORDER — DOCUSATE SODIUM 100 MG PO CAPS
100.0000 mg | ORAL_CAPSULE | Freq: Two times a day (BID) | ORAL | Status: DC
  Administered 2021-10-27 – 2021-10-28 (×2): 100 mg via ORAL
  Filled 2021-10-27 (×3): qty 1

## 2021-10-27 MED ORDER — BELLADONNA ALKALOIDS-OPIUM 16.2-60 MG RE SUPP
1.0000 | Freq: Four times a day (QID) | RECTAL | Status: DC | PRN
  Filled 2021-10-27: qty 1

## 2021-10-27 MED ORDER — MIDAZOLAM HCL 2 MG/2ML IJ SOLN
INTRAMUSCULAR | Status: AC
  Filled 2021-10-27: qty 2

## 2021-10-27 MED ORDER — LACTATED RINGERS IV SOLN: INTRAVENOUS | Status: DC

## 2021-10-27 MED ORDER — ONDANSETRON HCL 4 MG/2ML IJ SOLN: 4.0000 mg | INTRAMUSCULAR | Status: DC | PRN

## 2021-10-27 MED ORDER — CLINDAMYCIN PHOSPHATE 600 MG/50ML IV SOLN: 600.0000 mg | INTRAVENOUS | Status: DC

## 2021-10-27 MED ORDER — CHLORHEXIDINE GLUCONATE CLOTH 2 % EX PADS
6.0000 | MEDICATED_PAD | Freq: Every day | CUTANEOUS | Status: DC
  Administered 2021-10-28: 6 via TOPICAL

## 2021-10-27 MED ORDER — LIDOCAINE-EPINEPHRINE 1 %-1:100000 IJ SOLN
INTRAMUSCULAR | Status: AC
  Filled 2021-10-27: qty 2

## 2021-10-27 MED ORDER — PHENYLEPHRINE HCL-NACL 20-0.9 MG/250ML-% IV SOLN
INTRAVENOUS | Status: DC | PRN
  Administered 2021-10-27: 20 ug/min via INTRAVENOUS

## 2021-10-27 MED ORDER — HYDROCODONE-ACETAMINOPHEN 5-325 MG PO TABS
1.0000 | ORAL_TABLET | Freq: Four times a day (QID) | ORAL | 0 refills | Status: AC | PRN
Start: 2021-10-27 — End: ?

## 2021-10-27 MED ORDER — WATER FOR IRRIGATION, STERILE IR SOLN
Status: DC | PRN
  Administered 2021-10-27: 3000 mL

## 2021-10-27 MED ORDER — CLINDAMYCIN PHOSPHATE 2 % VA CREA
TOPICAL_CREAM | VAGINAL | Status: AC
  Filled 2021-10-27: qty 40

## 2021-10-27 MED ORDER — HYDROMORPHONE HCL 1 MG/ML IJ SOLN
INTRAMUSCULAR | Status: AC
  Filled 2021-10-27: qty 1

## 2021-10-27 MED ORDER — MIDAZOLAM HCL 2 MG/2ML IJ SOLN
INTRAMUSCULAR | Status: DC | PRN
  Administered 2021-10-27: 2 mg via INTRAVENOUS

## 2021-10-27 MED ORDER — ROCURONIUM BROMIDE 10 MG/ML (PF) SYRINGE
PREFILLED_SYRINGE | INTRAVENOUS | Status: AC
  Filled 2021-10-27: qty 10

## 2021-10-27 MED ORDER — LEVOTHYROXINE SODIUM 88 MCG PO TABS
88.0000 ug | ORAL_TABLET | Freq: Every morning | ORAL | Status: DC
  Administered 2021-10-28: 88 ug via ORAL
  Filled 2021-10-27: qty 1

## 2021-10-27 MED ORDER — PROPOFOL 10 MG/ML IV BOLUS
INTRAVENOUS | Status: DC | PRN
  Administered 2021-10-27: 120 mg via INTRAVENOUS

## 2021-10-27 MED ORDER — DEXTROSE-NACL 5-0.45 % IV SOLN: INTRAVENOUS | Status: DC

## 2021-10-27 MED ORDER — ONDANSETRON HCL 4 MG/2ML IJ SOLN
INTRAMUSCULAR | Status: DC | PRN
  Administered 2021-10-27: 4 mg via INTRAVENOUS

## 2021-10-27 MED ORDER — CLINDAMYCIN PHOSPHATE 2 % VA CREA
TOPICAL_CREAM | VAGINAL | Status: DC | PRN
  Administered 2021-10-27: 2 via VAGINAL

## 2021-10-27 MED ORDER — ONDANSETRON HCL 4 MG/2ML IJ SOLN
INTRAMUSCULAR | Status: AC
  Filled 2021-10-27: qty 2

## 2021-10-27 MED ORDER — CHLORHEXIDINE GLUCONATE 0.12 % MT SOLN
15.0000 mL | Freq: Once | OROMUCOSAL | Status: AC
  Administered 2021-10-27: 15 mL via OROMUCOSAL

## 2021-10-27 MED ORDER — DIPHENHYDRAMINE HCL 12.5 MG/5ML PO ELIX: 12.5000 mg | ORAL_SOLUTION | Freq: Four times a day (QID) | ORAL | Status: DC | PRN

## 2021-10-27 MED ORDER — 0.9 % SODIUM CHLORIDE (POUR BTL) OPTIME
TOPICAL | Status: DC | PRN
  Administered 2021-10-27: 1000 mL

## 2021-10-27 MED ORDER — ACETAMINOPHEN 325 MG PO TABS: 650.0000 mg | ORAL_TABLET | ORAL | Status: DC | PRN

## 2021-10-27 MED ORDER — ACETAMINOPHEN 500 MG PO TABS
1000.0000 mg | ORAL_TABLET | Freq: Once | ORAL | Status: AC
  Administered 2021-10-27: 1000 mg via ORAL
  Filled 2021-10-27: qty 2

## 2021-10-27 MED ORDER — GENTAMICIN SULFATE 40 MG/ML IJ SOLN
5.0000 mg/kg | INTRAVENOUS | Status: AC
  Administered 2021-10-27: 360 mg via INTRAVENOUS
  Filled 2021-10-27: qty 9

## 2021-10-27 MED ORDER — DOCUSATE SODIUM 100 MG PO CAPS: 100.0000 mg | ORAL_CAPSULE | Freq: Two times a day (BID) | ORAL | Status: AC

## 2021-10-27 MED ORDER — LIDOCAINE-EPINEPHRINE (PF) 1 %-1:200000 IJ SOLN
INTRAMUSCULAR | Status: DC | PRN
  Administered 2021-10-27: 20 mL

## 2021-10-27 MED ORDER — HYDROMORPHONE HCL 1 MG/ML IJ SOLN
0.5000 mg | INTRAMUSCULAR | Status: DC | PRN
  Administered 2021-10-27: 1 mg via INTRAVENOUS
  Administered 2021-10-27: 0.5 mg via INTRAVENOUS
  Administered 2021-10-27 – 2021-10-28 (×3): 1 mg via INTRAVENOUS
  Filled 2021-10-27 (×4): qty 1

## 2021-10-27 MED ORDER — ROCURONIUM BROMIDE 10 MG/ML (PF) SYRINGE
PREFILLED_SYRINGE | INTRAVENOUS | Status: DC | PRN
  Administered 2021-10-27 (×2): 10 mg via INTRAVENOUS
  Administered 2021-10-27: 80 mg via INTRAVENOUS

## 2021-10-27 MED ORDER — OXYCODONE HCL 5 MG PO TABS
5.0000 mg | ORAL_TABLET | ORAL | Status: DC | PRN
  Administered 2021-10-27 – 2021-10-28 (×3): 5 mg via ORAL
  Filled 2021-10-27 (×2): qty 1

## 2021-10-27 MED ORDER — DEXAMETHASONE SODIUM PHOSPHATE 10 MG/ML IJ SOLN
INTRAMUSCULAR | Status: AC
  Filled 2021-10-27: qty 1

## 2021-10-27 MED ORDER — DEXAMETHASONE SODIUM PHOSPHATE 10 MG/ML IJ SOLN
INTRAMUSCULAR | Status: DC | PRN
  Administered 2021-10-27: 8 mg via INTRAVENOUS

## 2021-10-27 MED ORDER — FENTANYL CITRATE PF 50 MCG/ML IJ SOSY
PREFILLED_SYRINGE | INTRAMUSCULAR | Status: AC
  Filled 2021-10-27: qty 3

## 2021-10-27 MED ORDER — FENTANYL CITRATE PF 50 MCG/ML IJ SOSY
25.0000 ug | PREFILLED_SYRINGE | INTRAMUSCULAR | Status: DC | PRN
  Administered 2021-10-27 (×3): 50 ug via INTRAVENOUS

## 2021-10-27 MED ORDER — OXYCODONE HCL 5 MG PO TABS
ORAL_TABLET | ORAL | Status: AC
  Filled 2021-10-27: qty 1

## 2021-10-27 MED ORDER — PHENAZOPYRIDINE HCL 200 MG PO TABS
200.0000 mg | ORAL_TABLET | Freq: Once | ORAL | Status: AC
  Administered 2021-10-27: 200 mg via ORAL
  Filled 2021-10-27: qty 1

## 2021-10-27 MED ORDER — LIDOCAINE 2% (20 MG/ML) 5 ML SYRINGE
INTRAMUSCULAR | Status: DC | PRN
  Administered 2021-10-27: 60 mg via INTRAVENOUS

## 2021-10-27 MED ORDER — SUMATRIPTAN SUCCINATE 50 MG PO TABS
100.0000 mg | ORAL_TABLET | Freq: Two times a day (BID) | ORAL | Status: DC | PRN
  Filled 2021-10-27: qty 2

## 2021-10-27 MED ORDER — SUGAMMADEX SODIUM 200 MG/2ML IV SOLN
INTRAVENOUS | Status: DC | PRN
  Administered 2021-10-27: 150 mg via INTRAVENOUS

## 2021-10-27 SURGICAL SUPPLY — 58 items
ALLOGRAFT TUTOPLAST AXIS 6X12 (Tissue) IMPLANT
BAG COUNTER SPONGE SURGICOUNT (BAG) IMPLANT
BAG DECANTER FOR FLEXI CONT (MISCELLANEOUS) ×1 IMPLANT
BAG DRN RND TRDRP ANRFLXCHMBR (UROLOGICAL SUPPLIES) ×1
BAG SPNG CNTER NS LX DISP (BAG)
BAG SURGICOUNT SPONGE COUNTING (BAG)
BAG URINE DRAIN 2000ML AR STRL (UROLOGICAL SUPPLIES) ×2 IMPLANT
BLADE SURG 15 STRL LF DISP TIS (BLADE) ×1 IMPLANT
BLADE SURG 15 STRL SS (BLADE) ×3
CATH FOLEY 2WAY SLVR  5CC 14FR (CATHETERS) ×3
CATH FOLEY 2WAY SLVR 5CC 14FR (CATHETERS) ×1 IMPLANT
COVER MAYO STAND STRL (DRAPES) ×3 IMPLANT
COVER SURGICAL LIGHT HANDLE (MISCELLANEOUS) ×3 IMPLANT
DECANTER SPIKE VIAL GLASS SM (MISCELLANEOUS) ×3 IMPLANT
DEVICE CAPIO SLIM SINGLE (INSTRUMENTS) ×2 IMPLANT
DRAIN PENROSE 0.25X18 (DRAIN) ×3 IMPLANT
DRAPE UNDERBUTTOCKS STRL (DISPOSABLE) ×3 IMPLANT
ELECT PENCIL ROCKER SW 15FT (MISCELLANEOUS) ×3 IMPLANT
GAUZE 4X4 16PLY ~~LOC~~+RFID DBL (SPONGE) ×6 IMPLANT
GAUZE PACKING 2X5 YD STRL (GAUZE/BANDAGES/DRESSINGS) ×4 IMPLANT
GLOVE SURG ENC MOIS LTX SZ6.5 (GLOVE) ×3 IMPLANT
GLOVE SURG ENC TEXT LTX SZ7.5 (GLOVE) ×9 IMPLANT
GLOVE SURG NEOPR MICRO LF 8.5 (GLOVE) ×1 IMPLANT
GOWN STRL REUS W/TWL XL LVL3 (GOWN DISPOSABLE) ×5 IMPLANT
HOLDER FOLEY CATH W/STRAP (MISCELLANEOUS) ×3 IMPLANT
KIT BASIN OR (CUSTOM PROCEDURE TRAY) ×3 IMPLANT
NDL MAYO 6 CRC TAPER PT (NEEDLE) ×1 IMPLANT
NEEDLE HYPO 22GX1.5 SAFETY (NEEDLE) ×3 IMPLANT
NEEDLE MAYO 6 CRC TAPER PT (NEEDLE) ×3 IMPLANT
NS IRRIG 1000ML POUR BTL (IV SOLUTION) ×3 IMPLANT
PACK CYSTO (CUSTOM PROCEDURE TRAY) ×3 IMPLANT
PAD OB MATERNITY 4.3X12.25 (PERSONAL CARE ITEMS) ×3 IMPLANT
PANTS MESH DISP LRG (UNDERPADS AND DIAPERS) ×1 IMPLANT
PANTS MESH DISPOSABLE L (UNDERPADS AND DIAPERS) ×2
PLUG CATH AND CAP STER (CATHETERS) ×3 IMPLANT
PROTECTOR NERVE ULNAR (MISCELLANEOUS) ×3 IMPLANT
RETRACTOR STAY HOOK 5MM (MISCELLANEOUS) ×3 IMPLANT
SHEET LAVH (DRAPES) ×3 IMPLANT
SUT ABS MONO DBL WITH NDL 48IN (SUTURE) ×4 IMPLANT
SUT CAPIO ETHIBPND (SUTURE) IMPLANT
SUT SILK 2 0 30  PSL (SUTURE) ×3
SUT SILK 2 0 30 PSL (SUTURE) IMPLANT
SUT VIC AB 0 CT1 27 (SUTURE) ×3
SUT VIC AB 0 CT1 27XBRD ANTBC (SUTURE) ×1 IMPLANT
SUT VIC AB 2-0 CT1 27 (SUTURE) ×6
SUT VIC AB 2-0 CT1 27XBRD (SUTURE) ×2 IMPLANT
SUT VIC AB 2-0 SH 27 (SUTURE) ×6
SUT VIC AB 2-0 SH 27X BRD (SUTURE) ×2 IMPLANT
SUT VIC AB 3-0 SH 27 (SUTURE) ×15
SUT VIC AB 3-0 SH 27XBRD (SUTURE) ×2 IMPLANT
SUT VICRYL 0 UR6 27IN ABS (SUTURE) ×6 IMPLANT
SYR 10ML LL (SYRINGE) ×3 IMPLANT
TOWEL OR 17X26 10 PK STRL BLUE (TOWEL DISPOSABLE) ×3 IMPLANT
TOWEL OR NON WOVEN STRL DISP B (DISPOSABLE) ×3 IMPLANT
TUBING CONNECTING 10 (TUBING) ×4 IMPLANT
TUBING CONNECTING 10' (TUBING) ×2
TUTOPLAST AXIS 6X12 (Tissue) ×3 IMPLANT
UNDERPAD 30X36 HEAVY ABSORB (UNDERPADS AND DIAPERS) ×3 IMPLANT

## 2021-10-27 NOTE — Anesthesia Procedure Notes (Signed)
Procedure Name: Intubation Date/Time: 10/27/2021 7:35 AM Performed by: Eben Burow, CRNA Pre-anesthesia Checklist: Patient identified, Emergency Drugs available, Suction available, Patient being monitored and Timeout performed Patient Re-evaluated:Patient Re-evaluated prior to induction Oxygen Delivery Method: Circle system utilized Preoxygenation: Pre-oxygenation with 100% oxygen Induction Type: IV induction Ventilation: Mask ventilation without difficulty Laryngoscope Size: Mac and 4 Grade View: Grade I Tube type: Oral Tube size: 7.0 mm Number of attempts: 1 Airway Equipment and Method: Stylet Placement Confirmation: ETT inserted through vocal cords under direct vision, positive ETCO2 and breath sounds checked- equal and bilateral Secured at: 21 cm Tube secured with: Tape Dental Injury: Teeth and Oropharynx as per pre-operative assessment

## 2021-10-27 NOTE — Transfer of Care (Signed)
Immediate Anesthesia Transfer of Care Note  Patient: Tonya Briggs  Procedure(s) Performed: ANTERIOR (CYSTOCELE) REPAIR VAGINAL VAULT SUSPENSION WITH GRAFT  Patient Location: PACU  Anesthesia Type:General  Level of Consciousness: awake, alert  and patient cooperative  Airway & Oxygen Therapy: Patient Spontanous Breathing and Patient connected to face mask oxygen  Post-op Assessment: Report given to RN and Post -op Vital signs reviewed and stable  Post vital signs: Reviewed and stable  Last Vitals:  Vitals Value Taken Time  BP 133/72 10/27/21 1033  Temp    Pulse 86 10/27/21 1037  Resp 10 10/27/21 1037  SpO2 100 % 10/27/21 1037  Vitals shown include unvalidated device data.  Last Pain:  Vitals:   10/27/21 0633  TempSrc:   PainSc: 0-No pain         Complications: No notable events documented.

## 2021-10-27 NOTE — Anesthesia Postprocedure Evaluation (Signed)
Anesthesia Post Note  Patient: [name]  Procedure(s) Performed: ANTERIOR (CYSTOCELE) REPAIR VAGINAL VAULT SUSPENSION WITH GRAFT     Patient location during evaluation: PACU Anesthesia Type: General Level of consciousness: awake and alert Pain management: pain level controlled Vital Signs Assessment: post-procedure vital signs reviewed and stable Respiratory status: spontaneous breathing, nonlabored ventilation, respiratory function stable and patient connected to nasal cannula oxygen Cardiovascular status: blood pressure returned to baseline and stable Postop Assessment: no apparent nausea or vomiting Anesthetic complications: no   No notable events documented.  Last Vitals:  Vitals:   10/27/21 1130 10/27/21 1140  BP: 96/73   Pulse: 78 66  Resp: (!) 21 13  Temp:    SpO2: 100% 97%    Last Pain:  Vitals:   10/27/21 1215  TempSrc:   PainSc: 5                  [name] L [name]

## 2021-10-27 NOTE — Interval H&P Note (Signed)
History and Physical Interval Note:  10/27/2021 6:59 AM  Tonya Briggs  has presented today for surgery, with the diagnosis of CYSTOCELE RECTOCELE VAULT PROLAPSE.  The various methods of treatment have been discussed with the patient and family. After consideration of risks, benefits and other options for treatment, the patient has consented to  Procedure(s) with comments: ANTERIOR (CYSTOCELE) AND POSSIBLE POSTERIOR REPAIR (RECTOCELE) (N/A) - REQUESTING 3.5 HRS FOR CASE VAGINAL VAULT SUSPENSION WIHT GRAFT (N/A) as a surgical intervention.  The patient's history has been reviewed, patient examined, no change in status, stable for surgery.  I have reviewed the patient's chart and labs.  Questions were answered to the patient's satisfaction.     Greely Atiyeh A Sameerah Nachtigal

## 2021-10-27 NOTE — Op Note (Signed)
Preoperative diagnosis: Vault prolapse and cystocele and small rectocele Postoperative diagnosis: Vault prolapse and cystocele and small rectocele Surgery: Vault prolapse repair plus cystocele repair and graft and cystoscopy Surgeon: Dr. Nicki Reaper Cledith Kamiya Assistant: Debbrah Alar  The patient has the above diagnosis and consented the above procedure.  Extra care was taken leg positioning to minimize risk of compartment syndrome and neuropathy and deep vein thrombosis.  Preoperative antibiotics were given.  On initial inspection I could easily identify the vaginal cuff and placed a 3-0 Vicryl suture.  She had lost approximately 4 cm of vaginal cuff length which is more than she had in the office.  She had no visible rectocele under anesthesia.  She had mild narrowing of the pubic arch.  I instilled 20 cc of a lidocaine epinephrine mixture.  I made my usual T-shaped anterior vaginal wall incision.  I dissected the underlying pubocervical fascia from the overlying vaginal epithelium to the white line bilaterally.  I mobilized well at the apex.  I did an anterior repair with running 2-0 Vicryl.  I reduced the central defect.  It was very anatomic.  It was a very long repair keeping the length.  I took down my retractor and then cystoscoped the patient.  There was no bladder injury.  There was a good repair cystoscopically.  Ureters were normal position effluxing normally  I then finger dissected along the appropriate plane to the ischial spine bilaterally.  The finger dissection [id number] at the level of the left spine took a few extra minutes to make certain I mobilize any soft tissue medially.  I could feel a flat sacrospinous ligament.  I did the same on the right side.  With the Capio device I placed 0 long-acting monofilament suture 1 full fingerbreadth medial to the spine in a straight line between the spines.  Each suture was well away from the spine and a few millimeters more caudal.  I double  checked with a rectal examination and there is no suture in the rectum.  I triple checked the position of the suture relative to the spine.  There was no bleeding.  With my usual technique I placed 0 Vicryl suture in the pelvic sidewall at the urethrovesical angle.  Of 12 x 6 graft was trimmed to 10 x 6 trapezoid.  It was laid in nicely tension-free with a 4 sutures placed.  I trimmed an appropriate amount of anterior vaginal wall epithelium and then closed it with running 2-0 Vicryl on a CT1 needle.  I then did a rectal examination.  She had diffuse posterior weakness but a very small distal defect.  She had a lot of laxity of the perineal body and pelvic sidewall and posterior vaginal wall.  She did not have a true defect.  She did not need a posterior repair in keeping with the findings in the office  I used 2 vaginal packs with the clindamycin cream.  Blood loss was approximately 200 mL.  Urine output was good.  I was very happy with the procedure.  Leg position was good.  Hopefully the procedure reaches the patient's treatment goal

## 2021-10-27 NOTE — Discharge Instructions (Signed)
  As discussed with Dr. Matilde Sprang.  You may resume aspirin, advil, aleve, vitamins, and supplements 7 days after surgery.

## 2021-10-27 NOTE — Plan of Care (Signed)
  Problem: Activity: Goal: Risk for activity intolerance will decrease Outcome: Progressing   Problem: Nutrition: Goal: Adequate nutrition will be maintained Outcome: Progressing   Problem: Education: Goal: Knowledge of General Education information will improve Description: Including pain rating scale, medication(s)/side effects and non-pharmacologic comfort measures Outcome: Completed/Met   Problem: Clinical Measurements: Goal: Respiratory complications will improve Outcome: Completed/Met   Problem: Education: Goal: Required Educational Video(s) Outcome: Completed/Met

## 2021-10-28 ENCOUNTER — Encounter (HOSPITAL_COMMUNITY): Payer: Self-pay | Admitting: Urology

## 2021-10-28 DIAGNOSIS — N8111 Cystocele, midline: Secondary | ICD-10-CM | POA: Diagnosis not present

## 2021-10-28 LAB — BASIC METABOLIC PANEL
Anion gap: 3 — ABNORMAL LOW (ref 5–15)
BUN: 8 mg/dL (ref 8–23)
CO2: 27 mmol/L (ref 22–32)
Calcium: 7.9 mg/dL — ABNORMAL LOW (ref 8.9–10.3)
Chloride: 104 mmol/L (ref 98–111)
Creatinine, Ser: 0.7 mg/dL (ref 0.44–1.00)
GFR, Estimated: >60 mL/min (ref >60)
Glucose, Bld: 532 mg/dL (ref 70–99)
Potassium: 3.2 mmol/L — ABNORMAL LOW (ref 3.5–5.1)
Sodium: 134 mmol/L — ABNORMAL LOW (ref 135–145)

## 2021-10-28 LAB — HEMOGLOBIN AND HEMATOCRIT, BLOOD
HCT: 30.8 % — ABNORMAL LOW (ref 36.0–46.0)
Hemoglobin: 9.9 g/dL — ABNORMAL LOW (ref 12.0–15.0)

## 2021-10-28 LAB — GLUCOSE, CAPILLARY: Glucose-Capillary: 95 mg/dL (ref 70–99)

## 2021-10-28 MED ORDER — INSULIN ASPART 100 UNIT/ML IJ SOLN: 5.0000 [IU] | Freq: Once | INTRAMUSCULAR | Status: DC

## 2021-10-28 NOTE — Progress Notes (Signed)
   10/28/21 0536  Provider Notification  Provider Name/Title Daine Gravel (on call provider)  Date Provider Notified 10/28/21  Time Provider Notified [phone]  Notification Type Call  Notification Reason Critical result  Test performed and critical result Glucose 532  Date Critical Result Received 10/28/21  Time Critical Result Received 0536  Provider response See new orders (CGB check at 0637: 95; did not require insulin)  Date of Provider Response 10/28/21  Time of Provider Response 0559   Patient ambulated at least once up and down [facility] twice and tolerated well.

## 2021-10-28 NOTE — Progress Notes (Signed)
Vaginal packing removed (per order) 2 long packing strips removed-per OR summary this is how many were placed. Foley also removed

## 2021-10-28 NOTE — Discharge Summary (Signed)
Date of admission: 10/27/2021  Date of discharge: 10/28/2021  Admission diagnosis: vault prolapse and cystocele  Discharge diagnosis: vault prolapse and cystocele midline  Secondary diagnoses: midline cystocele  History and Physical: For full details, please see admission history and physical. Briefly, Tonya Briggs is a 65 y.o. year old patient with the above diagnosis.   Hospital Course: Vault repair and cystocele repair and graft. Good post op course. Recheck glucose normal  Laboratory values:  Recent Labs    10/27/21 1627 10/28/21 0443  HGB 12.3 9.9*  HCT 37.3 30.8*   Recent Labs    10/28/21 0443  CREATININE 0.70    Disposition: Home  Discharge instruction: The patient was instructed to be ambulatory but told to refrain from heavy lifting, strenuous activity, or driving. Detailed  Discharge medications:  Allergies as of 10/28/2021   No Known Allergies      Medication List     STOP taking these medications    B-12 PO   CHOLEST OFF COMPLETE PO   KRILL OIL PO   MAGNESIUM PO   Melatonin 10 MG Tabs   naproxen sodium 220 MG tablet Commonly known as: ALEVE   zinc gluconate 50 MG tablet       TAKE these medications    cyclobenzaprine 10 MG tablet Commonly known as: FLEXERIL Take 10 mg by mouth at bedtime as needed for muscle spasms.   docusate sodium 100 MG capsule Commonly known as: COLACE Take 1 capsule (100 mg total) by mouth 2 (two) times daily.   estradiol 0.1 MG/24HR patch Commonly known as: VIVELLE-DOT Place 1 patch onto the skin every 7 (seven) days.   HYDROcodone-acetaminophen 5-325 MG tablet Commonly known as: Norco Take 1-2 tablets by mouth every 6 (six) hours as needed for moderate pain or severe pain.   levothyroxine 88 MCG tablet Commonly known as: SYNTHROID Take 88 mcg by mouth every morning.   SUMAtriptan 100 MG tablet Commonly known as: IMITREX TAKE 1 TABLET BY MOUTH TWICE A DAY IF NEEDED        Followup:    Follow-up Information     Taivon Haroon, Nicki Reaper, MD Follow up.   Specialty: Urology Why: office will call you with date and time of appt. Contact information: [place] [place] [address] [phone]

## 2021-10-28 NOTE — Plan of Care (Signed)
  Problem: Activity: Goal: Risk for activity intolerance will decrease Outcome: Progressing   Problem: Coping: Goal: Level of anxiety will decrease Outcome: Progressing   Problem: Elimination: Goal: Will not experience complications related to urinary retention Outcome: Progressing   Problem: Pain Managment: Goal: General experience of comfort will improve Outcome: Progressing   Problem: Safety: Goal: Ability to remain free from injury will improve Outcome: Progressing   Problem: Skin Integrity: Goal: Risk for impaired skin integrity will decrease Outcome: Progressing   Problem: Skin Integrity: Goal: Demonstration of wound healing without infection will improve Outcome: Progressing

## 2021-10-28 NOTE — Progress Notes (Signed)
Doing well Vitals OK Right high iliac/hip pain - mild- pain med given No rectal or leg pain Belly soft Spurious lab glucose of 532 from iv arm - repeat it was 94 Surgery detailed post op detailed Home later this am

## 2023-04-13 DIAGNOSIS — M25511 Pain in right shoulder: Secondary | ICD-10-CM | POA: Diagnosis not present

## 2023-04-21 DIAGNOSIS — S43431A Superior glenoid labrum lesion of right shoulder, initial encounter: Secondary | ICD-10-CM | POA: Diagnosis not present

## 2023-04-21 DIAGNOSIS — M7541 Impingement syndrome of right shoulder: Secondary | ICD-10-CM | POA: Diagnosis not present

## 2023-04-21 DIAGNOSIS — M19011 Primary osteoarthritis, right shoulder: Secondary | ICD-10-CM | POA: Diagnosis not present

## 2023-07-29 DIAGNOSIS — E11620 Type 2 diabetes mellitus with diabetic dermatitis: Secondary | ICD-10-CM | POA: Diagnosis not present

## 2023-07-29 DIAGNOSIS — M545 Low back pain, unspecified: Secondary | ICD-10-CM | POA: Diagnosis not present

## 2023-07-29 DIAGNOSIS — Z0000 Encounter for general adult medical examination without abnormal findings: Secondary | ICD-10-CM | POA: Diagnosis not present

## 2023-07-29 DIAGNOSIS — L309 Dermatitis, unspecified: Secondary | ICD-10-CM | POA: Diagnosis not present

## 2023-07-29 DIAGNOSIS — E039 Hypothyroidism, unspecified: Secondary | ICD-10-CM | POA: Diagnosis not present

## 2023-07-29 DIAGNOSIS — E119 Type 2 diabetes mellitus without complications: Secondary | ICD-10-CM | POA: Diagnosis not present

## 2023-07-29 DIAGNOSIS — E7800 Pure hypercholesterolemia, unspecified: Secondary | ICD-10-CM | POA: Diagnosis not present

## 2023-08-16 DIAGNOSIS — Z1231 Encounter for screening mammogram for malignant neoplasm of breast: Secondary | ICD-10-CM | POA: Diagnosis not present

## 2023-08-17 DIAGNOSIS — L570 Actinic keratosis: Secondary | ICD-10-CM | POA: Diagnosis not present

## 2023-08-17 DIAGNOSIS — L814 Other melanin hyperpigmentation: Secondary | ICD-10-CM | POA: Diagnosis not present

## 2023-08-17 DIAGNOSIS — D225 Melanocytic nevi of trunk: Secondary | ICD-10-CM | POA: Diagnosis not present

## 2023-08-17 DIAGNOSIS — L821 Other seborrheic keratosis: Secondary | ICD-10-CM | POA: Diagnosis not present

## 2023-08-17 DIAGNOSIS — L448 Other specified papulosquamous disorders: Secondary | ICD-10-CM | POA: Diagnosis not present

## 2023-11-11 DIAGNOSIS — L239 Allergic contact dermatitis, unspecified cause: Secondary | ICD-10-CM | POA: Diagnosis not present

## 2023-12-28 DIAGNOSIS — H43811 Vitreous degeneration, right eye: Secondary | ICD-10-CM | POA: Diagnosis not present

## 2024-02-06 DIAGNOSIS — E119 Type 2 diabetes mellitus without complications: Secondary | ICD-10-CM | POA: Diagnosis not present

## 2024-02-06 DIAGNOSIS — E7800 Pure hypercholesterolemia, unspecified: Secondary | ICD-10-CM | POA: Diagnosis not present

## 2024-02-06 DIAGNOSIS — E039 Hypothyroidism, unspecified: Secondary | ICD-10-CM | POA: Diagnosis not present

## 2024-02-06 DIAGNOSIS — M545 Low back pain, unspecified: Secondary | ICD-10-CM | POA: Diagnosis not present

## 2024-02-08 DIAGNOSIS — E119 Type 2 diabetes mellitus without complications: Secondary | ICD-10-CM | POA: Diagnosis not present

## 2024-02-14 DIAGNOSIS — L821 Other seborrheic keratosis: Secondary | ICD-10-CM | POA: Diagnosis not present

## 2024-02-14 DIAGNOSIS — L814 Other melanin hyperpigmentation: Secondary | ICD-10-CM | POA: Diagnosis not present

## 2024-02-14 DIAGNOSIS — L570 Actinic keratosis: Secondary | ICD-10-CM | POA: Diagnosis not present

## 2024-02-14 DIAGNOSIS — D225 Melanocytic nevi of trunk: Secondary | ICD-10-CM | POA: Diagnosis not present

## 2024-02-14 DIAGNOSIS — D492 Neoplasm of unspecified behavior of bone, soft tissue, and skin: Secondary | ICD-10-CM | POA: Diagnosis not present

## 2024-02-14 DIAGNOSIS — C44319 Basal cell carcinoma of skin of other parts of face: Secondary | ICD-10-CM | POA: Diagnosis not present

## 2024-02-14 DIAGNOSIS — L578 Other skin changes due to chronic exposure to nonionizing radiation: Secondary | ICD-10-CM | POA: Diagnosis not present

## 2024-03-27 DIAGNOSIS — L578 Other skin changes due to chronic exposure to nonionizing radiation: Secondary | ICD-10-CM | POA: Diagnosis not present

## 2024-03-27 DIAGNOSIS — L448 Other specified papulosquamous disorders: Secondary | ICD-10-CM | POA: Diagnosis not present

## 2024-03-27 DIAGNOSIS — Z09 Encounter for follow-up examination after completed treatment for conditions other than malignant neoplasm: Secondary | ICD-10-CM | POA: Diagnosis not present

## 2024-05-08 DIAGNOSIS — C44319 Basal cell carcinoma of skin of other parts of face: Secondary | ICD-10-CM | POA: Diagnosis not present

## 2024-05-12 DIAGNOSIS — E119 Type 2 diabetes mellitus without complications: Secondary | ICD-10-CM | POA: Diagnosis not present

## 2024-05-12 DIAGNOSIS — E7800 Pure hypercholesterolemia, unspecified: Secondary | ICD-10-CM | POA: Diagnosis not present

## 2024-05-12 DIAGNOSIS — E039 Hypothyroidism, unspecified: Secondary | ICD-10-CM | POA: Diagnosis not present

## 2024-06-11 DIAGNOSIS — E039 Hypothyroidism, unspecified: Secondary | ICD-10-CM | POA: Diagnosis not present

## 2024-06-11 DIAGNOSIS — E7800 Pure hypercholesterolemia, unspecified: Secondary | ICD-10-CM | POA: Diagnosis not present

## 2024-06-11 DIAGNOSIS — E119 Type 2 diabetes mellitus without complications: Secondary | ICD-10-CM | POA: Diagnosis not present

## 2024-07-09 DIAGNOSIS — J40 Bronchitis, not specified as acute or chronic: Secondary | ICD-10-CM | POA: Diagnosis not present

## 2024-07-09 DIAGNOSIS — R062 Wheezing: Secondary | ICD-10-CM | POA: Diagnosis not present

## 2024-07-09 DIAGNOSIS — R051 Acute cough: Secondary | ICD-10-CM | POA: Diagnosis not present

## 2024-07-12 DIAGNOSIS — E039 Hypothyroidism, unspecified: Secondary | ICD-10-CM | POA: Diagnosis not present

## 2024-07-12 DIAGNOSIS — E119 Type 2 diabetes mellitus without complications: Secondary | ICD-10-CM | POA: Diagnosis not present

## 2024-07-12 DIAGNOSIS — E7800 Pure hypercholesterolemia, unspecified: Secondary | ICD-10-CM | POA: Diagnosis not present

## 2024-08-12 DIAGNOSIS — E119 Type 2 diabetes mellitus without complications: Secondary | ICD-10-CM | POA: Diagnosis not present

## 2024-08-12 DIAGNOSIS — E7800 Pure hypercholesterolemia, unspecified: Secondary | ICD-10-CM | POA: Diagnosis not present

## 2024-08-12 DIAGNOSIS — E039 Hypothyroidism, unspecified: Secondary | ICD-10-CM | POA: Diagnosis not present

## 2024-08-14 DIAGNOSIS — M79672 Pain in left foot: Secondary | ICD-10-CM | POA: Diagnosis not present

## 2024-08-14 DIAGNOSIS — S93601A Unspecified sprain of right foot, initial encounter: Secondary | ICD-10-CM | POA: Diagnosis not present

## 2024-08-14 DIAGNOSIS — S93602A Unspecified sprain of left foot, initial encounter: Secondary | ICD-10-CM | POA: Diagnosis not present

## 2024-08-14 DIAGNOSIS — S92102A Unspecified fracture of left talus, initial encounter for closed fracture: Secondary | ICD-10-CM | POA: Diagnosis not present

## 2024-08-14 DIAGNOSIS — S93402A Sprain of unspecified ligament of left ankle, initial encounter: Secondary | ICD-10-CM | POA: Diagnosis not present

## 2024-08-14 DIAGNOSIS — M79671 Pain in right foot: Secondary | ICD-10-CM | POA: Diagnosis not present

## 2024-08-14 DIAGNOSIS — M25572 Pain in left ankle and joints of left foot: Secondary | ICD-10-CM | POA: Diagnosis not present

## 2024-08-14 DIAGNOSIS — M25571 Pain in right ankle and joints of right foot: Secondary | ICD-10-CM | POA: Diagnosis not present

## 2024-08-14 DIAGNOSIS — S93401A Sprain of unspecified ligament of right ankle, initial encounter: Secondary | ICD-10-CM | POA: Diagnosis not present

## 2024-08-22 DIAGNOSIS — Z1272 Encounter for screening for malignant neoplasm of vagina: Secondary | ICD-10-CM | POA: Diagnosis not present

## 2024-08-22 DIAGNOSIS — Z01419 Encounter for gynecological examination (general) (routine) without abnormal findings: Secondary | ICD-10-CM | POA: Diagnosis not present

## 2024-08-22 DIAGNOSIS — M810 Age-related osteoporosis without current pathological fracture: Secondary | ICD-10-CM | POA: Diagnosis not present

## 2024-08-22 DIAGNOSIS — Z6829 Body mass index (BMI) 29.0-29.9, adult: Secondary | ICD-10-CM | POA: Diagnosis not present

## 2024-08-22 DIAGNOSIS — Z1231 Encounter for screening mammogram for malignant neoplasm of breast: Secondary | ICD-10-CM | POA: Diagnosis not present

## 2024-08-22 DIAGNOSIS — M816 Localized osteoporosis [Lequesne]: Secondary | ICD-10-CM | POA: Diagnosis not present

## 2024-08-31 ENCOUNTER — Other Ambulatory Visit: Payer: Self-pay | Admitting: Orthopaedic Surgery

## 2024-08-31 ENCOUNTER — Ambulatory Visit
Admission: RE | Admit: 2024-08-31 | Discharge: 2024-08-31 | Disposition: A | Discharge status: Home / Self Care | Source: Ambulatory Visit | Attending: Orthopaedic Surgery | Admitting: Orthopaedic Surgery

## 2024-08-31 DIAGNOSIS — M25571 Pain in right ankle and joints of right foot: Secondary | ICD-10-CM | POA: Diagnosis not present

## 2024-08-31 DIAGNOSIS — M79672 Pain in left foot: Secondary | ICD-10-CM | POA: Diagnosis not present

## 2024-08-31 DIAGNOSIS — S92102A Unspecified fracture of left talus, initial encounter for closed fracture: Secondary | ICD-10-CM | POA: Diagnosis not present

## 2024-08-31 DIAGNOSIS — S93602D Unspecified sprain of left foot, subsequent encounter: Secondary | ICD-10-CM

## 2024-08-31 DIAGNOSIS — M25572 Pain in left ankle and joints of left foot: Secondary | ICD-10-CM | POA: Diagnosis not present

## 2024-08-31 DIAGNOSIS — S92315A Nondisplaced fracture of first metatarsal bone, left foot, initial encounter for closed fracture: Secondary | ICD-10-CM | POA: Diagnosis not present

## 2024-08-31 DIAGNOSIS — S93491A Sprain of other ligament of right ankle, initial encounter: Secondary | ICD-10-CM | POA: Diagnosis not present

## 2024-09-04 DIAGNOSIS — S93602D Unspecified sprain of left foot, subsequent encounter: Secondary | ICD-10-CM | POA: Diagnosis not present

## 2024-09-04 DIAGNOSIS — S93491A Sprain of other ligament of right ankle, initial encounter: Secondary | ICD-10-CM | POA: Diagnosis not present

## 2024-09-04 DIAGNOSIS — S92102A Unspecified fracture of left talus, initial encounter for closed fracture: Secondary | ICD-10-CM | POA: Diagnosis not present

## 2024-09-04 DIAGNOSIS — S92315A Nondisplaced fracture of first metatarsal bone, left foot, initial encounter for closed fracture: Secondary | ICD-10-CM | POA: Diagnosis not present

## 2024-09-10 DIAGNOSIS — S92312A Displaced fracture of first metatarsal bone, left foot, initial encounter for closed fracture: Secondary | ICD-10-CM | POA: Diagnosis not present

## 2024-09-10 DIAGNOSIS — G8918 Other acute postprocedural pain: Secondary | ICD-10-CM | POA: Diagnosis not present

## 2024-09-10 DIAGNOSIS — S92125A Nondisplaced fracture of body of left talus, initial encounter for closed fracture: Secondary | ICD-10-CM | POA: Diagnosis not present

## 2024-09-11 DIAGNOSIS — E7800 Pure hypercholesterolemia, unspecified: Secondary | ICD-10-CM | POA: Diagnosis not present

## 2024-09-11 DIAGNOSIS — E119 Type 2 diabetes mellitus without complications: Secondary | ICD-10-CM | POA: Diagnosis not present

## 2024-09-11 DIAGNOSIS — E039 Hypothyroidism, unspecified: Secondary | ICD-10-CM | POA: Diagnosis not present

## 2024-09-26 DIAGNOSIS — D1801 Hemangioma of skin and subcutaneous tissue: Secondary | ICD-10-CM | POA: Diagnosis not present

## 2024-09-26 DIAGNOSIS — D492 Neoplasm of unspecified behavior of bone, soft tissue, and skin: Secondary | ICD-10-CM | POA: Diagnosis not present

## 2024-10-12 DIAGNOSIS — E7800 Pure hypercholesterolemia, unspecified: Secondary | ICD-10-CM | POA: Diagnosis not present

## 2024-10-12 DIAGNOSIS — E039 Hypothyroidism, unspecified: Secondary | ICD-10-CM | POA: Diagnosis not present

## 2024-10-12 DIAGNOSIS — E119 Type 2 diabetes mellitus without complications: Secondary | ICD-10-CM | POA: Diagnosis not present

## 2024-10-23 DIAGNOSIS — S92102D Unspecified fracture of left talus, subsequent encounter for fracture with routine healing: Secondary | ICD-10-CM | POA: Diagnosis not present

## 2024-10-23 DIAGNOSIS — S92315D Nondisplaced fracture of first metatarsal bone, left foot, subsequent encounter for fracture with routine healing: Secondary | ICD-10-CM | POA: Diagnosis not present

## 2024-11-02 DIAGNOSIS — C44329 Squamous cell carcinoma of skin of other parts of face: Secondary | ICD-10-CM | POA: Diagnosis not present

## 2024-12-05 DIAGNOSIS — J209 Acute bronchitis, unspecified: Secondary | ICD-10-CM | POA: Diagnosis not present
